# Patient Record
Sex: Male | Born: 1955 | Hispanic: No | Marital: Married | Smoking: Former smoker
Health system: Southern US, Community
[De-identification: ages and names within clinical notes are randomized; demographics above are authoritative.]

## PROBLEM LIST (undated history)

## (undated) DIAGNOSIS — I10 Essential (primary) hypertension: Secondary | ICD-10-CM

## (undated) DIAGNOSIS — E7800 Pure hypercholesterolemia, unspecified: Secondary | ICD-10-CM

---

## 2005-06-27 ENCOUNTER — Emergency Department (HOSPITAL_COMMUNITY): Admission: EM | Admit: 2005-06-27 | Discharge: 2005-06-27 | Payer: Self-pay | Admitting: Emergency Medicine

## 2011-08-04 ENCOUNTER — Other Ambulatory Visit: Payer: Self-pay | Admitting: Physician Assistant

## 2013-05-10 ENCOUNTER — Encounter (HOSPITAL_COMMUNITY): Payer: Self-pay | Admitting: Emergency Medicine

## 2013-05-10 ENCOUNTER — Emergency Department (HOSPITAL_COMMUNITY)
Admission: EM | Admit: 2013-05-10 | Discharge: 2013-05-10 | Disposition: A | Discharge status: Home / Self Care | Payer: Self-pay | Attending: Emergency Medicine | Admitting: Emergency Medicine

## 2013-05-10 DIAGNOSIS — Z87891 Personal history of nicotine dependence: Secondary | ICD-10-CM | POA: Insufficient documentation

## 2013-05-10 DIAGNOSIS — R079 Chest pain, unspecified: Secondary | ICD-10-CM

## 2013-05-10 DIAGNOSIS — R0789 Other chest pain: Secondary | ICD-10-CM | POA: Insufficient documentation

## 2013-05-10 DIAGNOSIS — Z79899 Other long term (current) drug therapy: Secondary | ICD-10-CM | POA: Insufficient documentation

## 2013-05-10 DIAGNOSIS — I10 Essential (primary) hypertension: Secondary | ICD-10-CM | POA: Insufficient documentation

## 2013-05-10 LAB — POCT I-STAT TROPONIN I
TROPONIN I, POC: 0 ng/mL (ref 0.00–0.08)
TROPONIN I, POC: 0.01 ng/mL (ref 0.00–0.08)

## 2013-05-10 LAB — CBC
HEMATOCRIT: 36.9 % — AB (ref 39.0–52.0)
Hemoglobin: 13.2 g/dL (ref 13.0–17.0)
MCH: 32.1 pg (ref 26.0–34.0)
MCHC: 35.8 g/dL (ref 30.0–36.0)
MCV: 89.8 fL (ref 78.0–100.0)
Platelets: 298 10*3/uL (ref 150–400)
RBC: 4.11 MIL/uL — ABNORMAL LOW (ref 4.22–5.81)
RDW: 12.5 % (ref 11.5–15.5)
WBC: 8.8 10*3/uL (ref 4.0–10.5)

## 2013-05-10 LAB — COMPREHENSIVE METABOLIC PANEL
ALBUMIN: 3.6 g/dL (ref 3.5–5.2)
ALK PHOS: 66 U/L (ref 39–117)
ALT: 25 U/L (ref 0–53)
AST: 25 U/L (ref 0–37)
BILIRUBIN TOTAL: 0.3 mg/dL (ref 0.3–1.2)
BUN: 25 mg/dL — ABNORMAL HIGH (ref 6–23)
CO2: 25 mEq/L (ref 19–32)
CREATININE: 1.06 mg/dL (ref 0.50–1.35)
Calcium: 9.3 mg/dL (ref 8.4–10.5)
Chloride: 102 mEq/L (ref 96–112)
GFR calc non Af Amer: 76 mL/min — ABNORMAL LOW (ref >90)
GFR, EST AFRICAN AMERICAN: 88 mL/min — AB (ref >90)
GLUCOSE: 101 mg/dL — AB (ref 70–99)
Potassium: 4 mEq/L (ref 3.7–5.3)
Sodium: 140 mEq/L (ref 137–147)
Total Protein: 8 g/dL (ref 6.0–8.3)

## 2013-05-10 MED ORDER — CLONIDINE HCL 0.1 MG PO TABS
0.1000 mg | ORAL_TABLET | Freq: Once | ORAL | Status: AC
  Administered 2013-05-10: 0.1 mg via ORAL
  Filled 2013-05-10: qty 1

## 2013-05-10 MED ORDER — NITROGLYCERIN 0.4 MG SL SUBL: 0.4000 mg | SUBLINGUAL_TABLET | SUBLINGUAL | Status: DC | PRN

## 2013-05-10 MED ORDER — ASPIRIN 325 MG PO TABS
325.0000 mg | ORAL_TABLET | ORAL | Status: AC
  Administered 2013-05-10: 325 mg via ORAL
  Filled 2013-05-10: qty 1

## 2013-05-10 NOTE — ED Provider Notes (Signed)
CSN: [id number]     Arrival date & time 05/10/13  0025 History   First MD Initiated Contact with Patient 05/10/13 [phone]     Chief Complaint  Patient presents with  . Hypertension  . Chest Pain   (Consider location/radiation/quality/duration/timing/severity/associated sxs/prior Treatment) HPI Comments: 58 yo male with HTN, Chol, Smoker presents with htn and chest pressure.  Pt has had worsening bp since Friday in 180s to 200.  Pt saw pcp and he added amolodipine to his lisinopril/ hctz, pt took extra pill tonight with no improvement.  Chest pressure mild, anterior, non radiating.  Pt felt funny numbness in bilateral face that has resolved.  Sxs improved since arrival.  Pt has similar sxs when his bp is elevated.  No exertional sxs recently.  No known cardiac hx.   Patient is a 58 y.o. male presenting with hypertension and chest pain. The history is provided by the patient.  Hypertension Associated symptoms include chest pain. Pertinent negatives include no abdominal pain, no headaches and no shortness of breath.  Chest Pain Associated symptoms: no abdominal pain, no back pain, no diaphoresis, no fever, no headache, no shortness of breath and not vomiting     History reviewed. No pertinent past medical history. History reviewed. No pertinent past surgical history. History reviewed. No pertinent family history. History  Substance Use Topics  . Smoking status: Former Games developermoker  . Smokeless tobacco: Never Used  . Alcohol Use: Yes    Review of Systems  Constitutional: Negative for fever, chills and diaphoresis.  HENT: Negative for congestion.   Eyes: Negative for visual disturbance.  Respiratory: Negative for shortness of breath.   Cardiovascular: Positive for chest pain. Negative for leg swelling.  Gastrointestinal: Negative for vomiting and abdominal pain.  Genitourinary: Negative for dysuria and flank pain.  Musculoskeletal: Negative for back pain, neck pain and neck stiffness.  Skin:  Negative for rash.  Neurological: Negative for light-headedness and headaches.    Allergies  Review of patient's allergies indicates no known allergies.  Home Medications   Current Outpatient Rx  Name  Route  Sig  Dispense  Refill  . amLODipine (NORVASC) 10 MG tablet   Oral   Take 10 mg by mouth daily.         Marland Kitchen. lisinopril-hydrochlorothiazide (PRINZIDE,ZESTORETIC) 20-25 MG per tablet   Oral   Take 1 tablet by mouth daily.         Marland Kitchen. lovastatin (MEVACOR) 20 MG tablet   Oral   Take 20 mg by mouth daily.          BP 171/84  Pulse 61  Temp(Src) 98.5 F (36.9 C) (Oral)  Resp 12  Ht 5\' 6"  (1.676 m)  Wt 179 lb (81.194 kg)  BMI 28.91 kg/m2  SpO2 99% Physical Exam  Nursing note and vitals reviewed. Constitutional: He is oriented to person, place, and time. He appears well-developed and well-nourished.  HENT:  Head: Normocephalic and atraumatic.  Eyes: Conjunctivae are normal. Right eye exhibits no discharge. Left eye exhibits no discharge.  Neck: Normal range of motion. Neck supple. No tracheal deviation present.  Cardiovascular: Normal rate and regular rhythm.   Pulmonary/Chest: Effort normal and breath sounds normal.  Abdominal: Soft. He exhibits no distension. There is no tenderness. There is no guarding.  Musculoskeletal: He exhibits no edema.  Neurological: He is alert and oriented to person, place, and time. No cranial nerve deficit.  Skin: Skin is warm. No rash noted.  Psychiatric: He has a normal mood and  affect.    ED Course  Procedures (including critical care time) Labs Review Labs Reviewed  CBC - Abnormal; Notable for the following:    RBC 4.11 (*)    HCT 36.9 (*)    All other components within normal limits  COMPREHENSIVE METABOLIC PANEL - Abnormal; Notable for the following:    Glucose, Bld 101 (*)    BUN 25 (*)    GFR calc non Af Amer 76 (*)    GFR calc Af Amer 88 (*)    All other components within normal limits  POCT I-STAT TROPONIN I  POCT  I-STAT TROPONIN I   Imaging Review No results found.  EKG Interpretation    Date/Time:  Tuesday May 10 2013 00:37:32 EST Ventricular Rate:  60 PR Interval:  172 QRS Duration: 96 QT Interval:  416 QTC Calculation: 416 R Axis:   67 Text Interpretation:  Normal sinus rhythm Normal ECG Confirmed by Kaoir Loree  MD, Ananiah Maciolek ([id number]) on 05/10/2013 1:44:47 AM            MDM   1. HTN (hypertension)   2. Chest pain    Heart score 3, atypical cp, resolved as bp improved.  Delta troponin neg. Well appearing, normal bp, CP free on recheck. Clonidine po given .1 mg.  BP improved.   Discussed close fup outpt for stress test, pt agrees with plan.  No signs of end organ damage at this time.   Results and differential diagnosis were discussed with the patient. Close follow up outpatient was discussed, patient comfortable with the plan.  Filed Vitals:   05/10/13 0106 05/10/13 0200 05/10/13 0205 05/10/13 0332  BP: 171/84 148/81 148/81 105/74  Pulse: 61 61  56  Temp: 98.5 F (36.9 C)   97.7 F (36.5 C)  TempSrc: Oral   Oral  Resp: 12 15  13   Height:      Weight:      SpO2: 99% 96%  97%   Diagnosis: above    Enid Skeens, MD 05/10/13 [phone]

## 2013-05-10 NOTE — ED Notes (Signed)
Pt is here because he checked his blood pressure and it was 200/100, and he wanted further evaluation

## 2013-05-10 NOTE — ED Notes (Signed)
PT reports high B/P tonight and CP . Pt was seen today by PCP and Meds were adjusted today  For hypertension. Pt reports he took an extra Lisinopril 25mg / Hydrochlorothiazide25mg   Tonight.

## 2013-05-10 NOTE — Discharge Instructions (Signed)
Take baby aspirin daily until cleared by your physician. Call your physician tomorrow to discuss further evaluation and stress test.  If you were given medicines take as directed.  If you are on coumadin or contraceptives realize their levels and effectiveness is altered by many different medicines.  If you have any reaction (rash, tongues swelling, other) to the medicines stop taking and see a physician.   Please follow up as directed and return to the ER or see a physician for new or worsening symptoms.  Thank you.  Chest Pain (Nonspecific) Chest pain has many causes. Your pain could be caused by something serious, such as a heart attack or a blood clot in the lungs. It could also be caused by something less serious, such as a chest bruise or a virus. Follow up with your doctor. More lab tests or other studies may be needed to find the cause of your pain. Most of the time, nonspecific chest pain will improve within 2 to 3 days of rest and mild pain medicine. HOME CARE  For chest bruises, you may put ice on the sore area for 15-20 minutes, 03-04 times a day. Do this only if it makes you feel better.  Put ice in a plastic bag.  Place a towel between the skin and the bag.  Rest for the next 2 to 3 days.  Go back to work if the pain improves.  See your doctor if the pain lasts longer than 1 to 2 weeks.  Only take medicine as told by your doctor.  Quit smoking if you smoke. GET HELP RIGHT AWAY IF:   There is more pain or pain that spreads to the arm, neck, jaw, back, or belly (abdomen).  You have shortness of breath.  You cough more than usual or cough up blood.  You have very bad back or belly pain, feel sick to your stomach (nauseous), or throw up (vomit).  You have very bad weakness.  You pass out (faint).  You have a fever. Any of these problems may be serious and may be an emergency. Do not wait to see if the problems will go away. Get medical help right away. Call your  local emergency services 911 in [place] Do not drive yourself to the hospital. MAKE SURE YOU:   Understand these instructions.  Will watch this condition.  Will get help right away if you or your child is not doing well or gets worse. Document Released: 09/24/2007 Document Revised: 06/30/2011 Document Reviewed: 09/24/2007 [facility] Patient Information 2014 [employer] [place].

## 2013-05-10 NOTE — ED Notes (Signed)
Pt stated that when his pressure increases he itches around his face and eyes

## 2015-12-25 ENCOUNTER — Ambulatory Visit (INDEPENDENT_AMBULATORY_CARE_PROVIDER_SITE_OTHER): Payer: Self-pay | Admitting: Family Medicine

## 2015-12-25 VITALS — BP 138/78 | HR 58 | Temp 98.1°F | Resp 16 | Ht 66.0 in | Wt 185.0 lb

## 2015-12-25 DIAGNOSIS — Z113 Encounter for screening for infections with a predominantly sexual mode of transmission: Secondary | ICD-10-CM

## 2015-12-25 DIAGNOSIS — Z202 Contact with and (suspected) exposure to infections with a predominantly sexual mode of transmission: Secondary | ICD-10-CM

## 2015-12-25 DIAGNOSIS — J300 Vasomotor rhinitis: Secondary | ICD-10-CM

## 2015-12-25 MED ORDER — AZELASTINE HCL 0.1 % NA SOLN: 1.0000 | Freq: Every evening | NASAL | 1 refills | Status: DC | PRN

## 2015-12-25 MED ORDER — METRONIDAZOLE 500 MG PO TABS: ORAL_TABLET | ORAL | 0 refills | Status: DC

## 2015-12-25 NOTE — Patient Instructions (Addendum)
  If your wife was diagnosed with trichomonas, start the antibiotic as we prescribed - twice per day for 1 week. I did check other sexually transmitted infection testing today.  Your congestion the night is likely due to vasomotor rhinitis. Try the Astelin nasal spray 1 spray in each nostril at bedtime. Follow-up in the next few weeks if that is not helping.  Return to the clinic or go to the nearest emergency room if any of your symptoms worsen or new symptoms occur.    IF you received an x-ray today, you will receive an invoice from [facility]. Please contact [facility] at [phone] with questions or concerns regarding your invoice.   IF you received labwork today, you will receive an invoice from [facility]. Please contact [facility] at [phone] with questions or concerns regarding your invoice.   Our billing staff will not be able to assist you with questions regarding bills from these companies.  You will be contacted with the lab results as soon as they are available. The fastest way to get your results is to activate your My Chart account. Instructions are located on the last page of this paperwork. If you have not heard from us regarding the results in 2 weeks, please contact this office.

## 2015-12-25 NOTE — Progress Notes (Signed)
By signing my name below I, Darryl LewandowskyJoseph Boyd, attest that this documentation has been prepared under the direction and in the presence of Darryl FloodJeffrey R Promyse Ardito, MD. Electonically Signed. Darryl LewandowskyJoseph Boyd, Scribe 12/25/2015 at 5:58 PM  Subjective:    Patient ID: Darryl Boyd, male    DOB: 07/05/1955, 60 y.o.   MRN: [id number]  Chief Complaint  Patient presents with  . Other    patient's wife was just treated for a infection, pt doesn't know what type but was recommended to get checked    HPI Darryl MantisSalvador B Pare is a 60 y.o. male who presents to the [facility] to be checked for genital infection. Pt states that his wife was told by her OB/gyn that she had an infection that could be given to her sexual partner and pt's wife was given flagyl to take for a week. Pt unsure what type of infection.  Pt denies any penile discharge, dysuria, or penile bumps/rash. Pt has been married for 40 years and denies any known sexual partners outside of marriage.   Pt denies any history of STIs.   Pt also reports night time nasal congestion for the past several months.   There are no active problems to display for this patient.  No past medical history on file. No past surgical history on file. No Known Allergies Prior to Admission medications   Medication Sig Start Date End Date Taking? Authorizing Provider  lisinopril-hydrochlorothiazide (PRINZIDE,ZESTORETIC) 20-25 MG per tablet Take 1 tablet by mouth daily.   Yes Historical Provider, MD  lovastatin (MEVACOR) 20 MG tablet Take 20 mg by mouth daily.   Yes Historical Provider, MD  verapamil (CALAN) 80 MG tablet Take 80 mg by mouth 3 (three) times daily.   Yes Historical Provider, MD   Social History   Social History  . Marital status: Married    Spouse name: N/A  . Number of children: N/A  . Years of education: N/A   Occupational History  . Not on file.   Social History Main Topics  . Smoking status: Former Games developermoker  . Smokeless  tobacco: Never Used  . Alcohol use Yes  . Drug use: No  . Sexual activity: Not on file   Other Topics Concern  . Not on file   Social History Narrative  . No narrative on file      Review of Systems  Constitutional: Negative for fever.  HENT: Positive for congestion (night time nasal congestion).   Genitourinary: Negative for difficulty urinating, discharge, dysuria, genital sores, penile pain, penile swelling and testicular pain.       Objective:   Physical Exam  Constitutional: He is oriented to person, place, and time. He appears well-developed and well-nourished. No distress.  HENT:  Head: Normocephalic and atraumatic.  Right Ear: Tympanic membrane, external ear and ear canal normal.  Left Ear: Tympanic membrane, external ear and ear canal normal.  Nose: Mucosal edema (turbinates) present. No rhinorrhea. Right sinus exhibits no maxillary sinus tenderness and no frontal sinus tenderness. Left sinus exhibits no maxillary sinus tenderness and no frontal sinus tenderness.  Mouth/Throat: Oropharynx is clear and moist and mucous membranes are normal. No oropharyngeal exudate or posterior oropharyngeal erythema.  Eyes: Conjunctivae are normal. Pupils are equal, round, and reactive to light.  Neck: Neck supple.  Cardiovascular: Normal rate, regular rhythm, normal heart sounds and intact distal pulses.  Exam reveals no gallop and no friction rub.   No murmur heard. Pulmonary/Chest: Effort normal and  breath sounds normal. No accessory muscle usage. He has no decreased breath sounds. He has no wheezes. He has no rhonchi. He has no rales.  Abdominal: Soft. Normal appearance and bowel sounds are normal. He exhibits no distension. There is no tenderness. There is no rebound and no guarding.  Musculoskeletal: Normal range of motion.  Lymphadenopathy:    He has no cervical adenopathy.  Neurological: He is alert and oriented to person, place, and time.  Skin: Skin is warm and dry. No rash  noted.  Psychiatric: He has a normal mood and affect. His behavior is normal.  Nursing note and vitals reviewed.   Vitals:   12/25/15 1639  BP: 138/78  Pulse: (!) 58  Resp: 16  Temp: 98.1 F (36.7 C)  SpO2: 98%  Weight: 185 lb (83.9 kg)  Height: 5\' 6"  (1.676 m)         Assessment & Plan:   Darryl Boyd is a 60 y.o. male Routine screening for STI (sexually transmitted infection) - Plan: GC/Chlamydia Probe Amp, RPR, HIV antibody Exposure to trichomonas - Plan: GC/Chlamydia Probe Amp, RPR, HIV antibody, metroNIDAZOLE (FLAGYL) 500 MG tablet  -Based on the description of his wife's visit and fact she is on flagyl, as well as advice from her OB/GYN for him to be tested and treated, this appears to be trichomonas, not bacterial vaginosis. Advised him to double check and make sure, and if it was trichomonas, can start Flagyl 500 mg twice a day for 1 week. Option of single 2 g dose was given, but he preferred 500 mg dosing. Other STI testing was performed. Currently asymptomatic.  Vasomotor rhinitis - Plan: azelastine (ASTELIN) 0.1 % nasal spray  -Nighttime symptoms. Can try Astelin nasal spray, then follow-up if not improving.  Meds ordered this encounter  . metroNIDAZOLE (FLAGYL) 500 MG tablet    Sig: 1 pill by mouth twice per day.  Avoid any alcohol while taking this medicine.    Dispense:  14 tablet    Refill:  0  . azelastine (ASTELIN) 0.1 % nasal spray    Sig: Place 1 spray into both nostrils at bedtime as needed for rhinitis. Use in each nostril as directed    Dispense:  30 mL    Refill:  1   Patient Instructions    If your wife was diagnosed with trichomonas, start the antibiotic as we prescribed - twice per day for 1 week. I did check other sexually transmitted infection testing today.  Your congestion the night is likely due to vasomotor rhinitis. Try the Astelin nasal spray 1 spray in each nostril at bedtime. Follow-up in the next few weeks if that is not  helping.  Return to the clinic or go to the nearest emergency room if any of your symptoms worsen or new symptoms occur.    IF you received an x-ray today, you will receive an invoice from [facility]. Please contact [facility] at [phone] with questions or concerns regarding your invoice.   IF you received labwork today, you will receive an invoice from [facility]. Please contact [facility] at [phone] with questions or concerns regarding your invoice.   Our billing staff will not be able to assist you with questions regarding bills from these companies.  You will be contacted with the lab results as soon as they are available. The fastest way to get your results is to activate your My Chart account. Instructions are located on the last page of this  paperwork. If you have not heard from [place] regarding the results in 2 weeks, please contact this office.        I personally performed the services described in this documentation, which was scribed in my presence. The recorded information has been reviewed and considered, and addended by me as needed.   Signed,   Meredith Staggers, MD [facility].  12/26/15 11:11 AM

## 2015-12-26 LAB — HIV ANTIBODY (ROUTINE TESTING W REFLEX): HIV 1&2 Ab, 4th Generation: NONREACTIVE

## 2015-12-27 LAB — GC/CHLAMYDIA PROBE AMP
CT Probe RNA: NOT DETECTED
GC Probe RNA: NOT DETECTED

## 2015-12-27 LAB — RPR

## 2016-04-17 ENCOUNTER — Ambulatory Visit (INDEPENDENT_AMBULATORY_CARE_PROVIDER_SITE_OTHER): Payer: Self-pay | Admitting: Family Medicine

## 2016-04-17 VITALS — BP 124/78 | HR 80 | Temp 99.7°F | Resp 16 | Ht 66.0 in | Wt 184.0 lb

## 2016-04-17 DIAGNOSIS — R0981 Nasal congestion: Secondary | ICD-10-CM

## 2016-04-17 DIAGNOSIS — R509 Fever, unspecified: Secondary | ICD-10-CM

## 2016-04-17 DIAGNOSIS — J069 Acute upper respiratory infection, unspecified: Secondary | ICD-10-CM

## 2016-04-17 DIAGNOSIS — R059 Cough, unspecified: Secondary | ICD-10-CM

## 2016-04-17 DIAGNOSIS — R05 Cough: Secondary | ICD-10-CM

## 2016-04-17 MED ORDER — HYDROCOD POLST-CPM POLST ER 10-8 MG/5ML PO SUER: 5.0000 mL | Freq: Every evening | ORAL | 0 refills | Status: DC | PRN

## 2016-04-17 MED ORDER — FLUTICASONE PROPIONATE 50 MCG/ACT NA SUSP: 2.0000 | Freq: Every day | NASAL | 6 refills | Status: DC

## 2016-04-17 MED ORDER — BENZONATATE 100 MG PO CAPS: 100.0000 mg | ORAL_CAPSULE | Freq: Three times a day (TID) | ORAL | 2 refills | Status: DC | PRN

## 2016-04-17 NOTE — Patient Instructions (Addendum)
Take flonase by spraying 2 sprays per nostril once a day Continue tylenol for aches and pains and fevers Take tussionex pennkinetic ER at bedtime for cough.  This medication has hydrocodone which is made from morphine.  Do not operate machinery or drive while taking this medication as it can make you drowsy or dizzy Take benzonatate (tessalon perles) up to 3 times a day as needed   IF you received an x-ray today, you will receive an invoice from [facility]. Please contact [facility] at [phone] with questions or concerns regarding your invoice.   IF you received labwork today, you will receive an invoice from [facility]. Please contact [facility] at [phone] with questions or concerns regarding your invoice.   Our billing staff will not be able to assist you with questions regarding bills from these companies.  You will be contacted with the lab results as soon as they are available. The fastest way to get your results is to activate your My Chart account. Instructions are located on the last page of this paperwork. If you have not heard from us regarding the results in 2 weeks, please contact this office.      Upper Respiratory Infection, Adult Most upper respiratory infections (URIs) are a viral infection of the air passages leading to the lungs. A URI affects the nose, throat, and upper air passages. The most common type of URI is nasopharyngitis and is typically referred to as "the common cold." URIs run their course and usually go away on their own. Most of the time, a URI does not require medical attention, but sometimes a bacterial infection in the upper airways can follow a viral infection. This is called a secondary infection. Sinus and middle ear infections are common types of secondary upper respiratory infections. Bacterial pneumonia can also complicate a URI. A URI can worsen asthma and chronic obstructive pulmonary disease (COPD). Sometimes, these  complications can require emergency medical care and may be life threatening. What are the causes? Almost all URIs are caused by viruses. A virus is a type of germ and can spread from one person to another. What increases the risk? You may be at risk for a URI if:  You smoke.  You have chronic heart or lung disease.  You have a weakened defense (immune) system.  You are very young or very old.  You have nasal allergies or asthma.  You work in crowded or poorly ventilated areas.  You work in health care facilities or schools. What are the signs or symptoms? Symptoms typically develop 2-3 days after you come in contact with a cold virus. Most viral URIs last 7-10 days. However, viral URIs from the influenza virus (flu virus) can last 14-18 days and are typically more severe. Symptoms may include:  Runny or stuffy (congested) nose.  Sneezing.  Cough.  Sore throat.  Headache.  Fatigue.  Fever.  Loss of appetite.  Pain in your forehead, behind your eyes, and over your cheekbones (sinus pain).  Muscle aches. How is this diagnosed? Your health care provider may diagnose a URI by:  Physical exam.  Tests to check that your symptoms are not due to another condition such as:  Strep throat.  Sinusitis.  Pneumonia.  Asthma. How is this treated? A URI goes away on its own with time. It cannot be cured with medicines, but medicines may be prescribed or recommended to relieve symptoms. Medicines may help:  Reduce your fever.  Reduce your cough.  Relieve nasal congestion.  Follow these instructions at home:  Take medicines only as directed by your health care provider.  Gargle warm saltwater or take cough drops to comfort your throat as directed by your health care provider.  Use a warm mist humidifier or inhale steam from a shower to increase air moisture. This may make it easier to breathe.  Drink enough fluid to keep your urine clear or pale yellow.  Eat  soups and other clear broths and maintain good nutrition.  Rest as needed.  Return to work when your temperature has returned to normal or as your health care provider advises. You may need to stay home longer to avoid infecting others. You can also use a face mask and careful hand washing to prevent spread of the virus.  Increase the usage of your inhaler if you have asthma.  Do not use any tobacco products, including cigarettes, chewing tobacco, or electronic cigarettes. If you need help quitting, ask your health care provider. How is this prevented? The best way to protect yourself from getting a cold is to practice good hygiene.  Avoid oral or hand contact with people with cold symptoms.  Wash your hands often if contact occurs. There is no clear evidence that vitamin C, vitamin E, echinacea, or exercise reduces the chance of developing a cold. However, it is always recommended to get plenty of rest, exercise, and practice good nutrition. Contact a health care provider if:  You are getting worse rather than better.  Your symptoms are not controlled by medicine.  You have chills.  You have worsening shortness of breath.  You have brown or red mucus.  You have yellow or brown nasal discharge.  You have pain in your face, especially when you bend forward.  You have a fever.  You have swollen neck glands.  You have pain while swallowing.  You have white areas in the back of your throat. Get help right away if:  You have severe or persistent:  Headache.  Ear pain.  Sinus pain.  Chest pain.  You have chronic lung disease and any of the following:  Wheezing.  Prolonged cough.  Coughing up blood.  A change in your usual mucus.  You have a stiff neck.  You have changes in your:  Vision.  Hearing.  Thinking.  Mood. This information is not intended to replace advice given to you by your health care provider. Make sure you discuss any questions you have  with your health care provider. Document Released: 10/01/2000 Document Revised: 12/09/2015 Document Reviewed: 07/13/2013 Elsevier Interactive Patient Education  2017 [employer]

## 2016-04-17 NOTE — Progress Notes (Signed)
Chief Complaint  Patient presents with  . Diarrhea    symptoms began 2 weeks ago  . Chills  . Fever  . Headache  . Nausea  . Nasal Congestion  . Cough    HPI Pt reports that he has been sick for 2 weeks 2 weeks ago he had chills and one episode of diarrhea then his symptoms got worse with headache, nasal congestion, fever, chills, nausea but no vomiting, no skin rash.  He reports that last night he felt hot and check a temperature and it was 100.5 oral.  He took tylenol for fever. His grandchildren were sick before he got sick.  No asthma He smokes 1 cigarette a week He is taking all his regular medication   No past medical history on file.  Current Outpatient Prescriptions  Medication Sig Dispense Refill  . lisinopril-hydrochlorothiazide (PRINZIDE,ZESTORETIC) 20-25 MG per tablet Take 1 tablet by mouth daily.    Marland Kitchen. lovastatin (MEVACOR) 20 MG tablet Take 20 mg by mouth daily.    . verapamil (CALAN) 80 MG tablet Take 80 mg by mouth once.     . benzonatate (TESSALON PERLES) 100 MG capsule Take 1 capsule (100 mg total) by mouth 3 (three) times daily as needed for cough. 30 capsule 2  . chlorpheniramine-HYDROcodone (TUSSIONEX PENNKINETIC ER) 10-8 MG/5ML SUER Take 5 mLs by mouth at bedtime as needed for cough. 115 mL 0  . fluticasone (FLONASE) 50 MCG/ACT nasal spray Place 2 sprays into both nostrils daily. 16 g 6   No current facility-administered medications for this visit.     Allergies: No Known Allergies  No past surgical history on file.  Social History   Social History  . Marital status: Married    Spouse name: N/A  . Number of children: N/A  . Years of education: N/A   Social History Main Topics  . Smoking status: Former Games developermoker  . Smokeless tobacco: Never Used  . Alcohol use Yes  . Drug use: No  . Sexual activity: Not Asked   Other Topics Concern  . None   Social History Narrative  . None    ROS See hpi Objective: Vitals:   04/17/16 1120  BP:  124/78  Pulse: 80  Resp: 16  Temp: 99.7 F (37.6 C)  TempSrc: Oral  SpO2: 97%  Weight: 184 lb (83.5 kg)  Height: 5\' 6"  (1.676 m)    Physical Exam General: alert, oriented, in NAD Head: normocephalic, atraumatic, no sinus tenderness Eyes: EOM intact, no scleral icterus or conjunctival injection Ears: TM clear bilaterally Throat: no pharyngeal exudate or erythema Lymph: no posterior auricular, submental or cervical lymph adenopathy Heart: normal rate, normal sinus rhythm, no murmurs Lungs: clear to auscultation bilaterally, no wheezing Abdomen: nondistended, normoactive bs, soft, nontender, no rebound or guarding  Assessment and Plan Darryl Boyd was seen today for diarrhea, chills, fever, headache, nausea, nasal congestion and cough.  Diagnoses and all orders for this visit:  Cough Acute URI Sinus congestion Fever and chills  Advised pt to continue hydration and tylenol Discussed ways to suppress cough  Will try tussionex for cough suppression  -     benzonatate (TESSALON PERLES) 100 MG capsule; Take 1 capsule (100 mg total) by mouth 3 (three) times daily as needed for cough. -     chlorpheniramine-HYDROcodone (TUSSIONEX PENNKINETIC ER) 10-8 MG/5ML SUER; Take 5 mLs by mouth at bedtime as needed for cough. -     fluticasone (FLONASE) 50 MCG/ACT nasal spray; Place 2 sprays into both  nostrils daily.     Darryl Boyd A Darryl Boyd

## 2017-06-15 ENCOUNTER — Ambulatory Visit (INDEPENDENT_AMBULATORY_CARE_PROVIDER_SITE_OTHER): Payer: Self-pay | Admitting: Family Medicine

## 2017-06-15 ENCOUNTER — Other Ambulatory Visit: Payer: Self-pay

## 2017-06-15 ENCOUNTER — Encounter: Payer: Self-pay | Admitting: Family Medicine

## 2017-06-15 VITALS — BP 156/89 | HR 70 | Temp 98.5°F | Resp 16 | Ht 66.0 in | Wt 189.2 lb

## 2017-06-15 DIAGNOSIS — E785 Hyperlipidemia, unspecified: Secondary | ICD-10-CM

## 2017-06-15 DIAGNOSIS — Z1211 Encounter for screening for malignant neoplasm of colon: Secondary | ICD-10-CM

## 2017-06-15 DIAGNOSIS — Z6830 Body mass index (BMI) 30.0-30.9, adult: Secondary | ICD-10-CM

## 2017-06-15 DIAGNOSIS — I10 Essential (primary) hypertension: Secondary | ICD-10-CM

## 2017-06-15 DIAGNOSIS — L290 Pruritus ani: Secondary | ICD-10-CM

## 2017-06-15 DIAGNOSIS — E6609 Other obesity due to excess calories: Secondary | ICD-10-CM

## 2017-06-15 MED ORDER — VERAPAMIL HCL 80 MG PO TABS: 80.0000 mg | ORAL_TABLET | Freq: Every day | ORAL | 1 refills | Status: AC

## 2017-06-15 MED ORDER — LOVASTATIN 20 MG PO TABS: 20.0000 mg | ORAL_TABLET | Freq: Every day | ORAL | 1 refills | Status: AC

## 2017-06-15 MED ORDER — HYDROCORTISONE 2.5 % RE CREA: 1.0000 "application " | TOPICAL_CREAM | Freq: Two times a day (BID) | RECTAL | 0 refills | Status: DC

## 2017-06-15 MED ORDER — LISINOPRIL-HYDROCHLOROTHIAZIDE 20-25 MG PO TABS: 1.0000 | ORAL_TABLET | Freq: Every day | ORAL | 1 refills | Status: AC

## 2017-06-15 MED ORDER — FAMOTIDINE 20 MG PO TABS: 20.0000 mg | ORAL_TABLET | Freq: Two times a day (BID) | ORAL | 3 refills | Status: DC

## 2017-06-15 MED ORDER — OMEPRAZOLE 20 MG PO CPDR: 20.0000 mg | DELAYED_RELEASE_CAPSULE | Freq: Every day | ORAL | 3 refills | Status: DC

## 2017-06-15 NOTE — Progress Notes (Signed)
Chief Complaint  Patient presents with  . Cough    follow up, pt doesn't have cough now.  Pt wanting refills on lisinopril-hctz, lovastatin, and verapamil.  Per pt he forgot to take meds today.    HPI   Hypertension: Patient here for follow-up of elevated blood pressure. He is not exercising and is adherent to low salt diet.  Blood pressure is well controlled at home. He missed his doses today.  He reports that he was seeing a doctor elsewhere who refilled his medications and checked labs.  Cardiac symptoms none. Patient denies chest pain, chest pressure/discomfort, dyspnea, exertional chest pressure/discomfort, fatigue, lower extremity edema and palpitations.  Cardiovascular risk factors: advanced age (older than 33 for men, 11 for women), dyslipidemia, hypertension and male gender. Use of agents associated with hypertension: none. History of target organ damage: none. Lab Results  Component Value Date   CREATININE 1.06 05/10/2013    Dyslipidemia: Patient presents for evaluation of lipids.  Compliance with treatment thus far has been good.  A repeat fasting lipid profile was ordered.  The patient does use medications that may worsen dyslipidemias (corticosteroids, progestins, anabolic steroids, diuretics, beta-blockers, amiodarone, cyclosporine, olanzapine). The patient exercises never.  The patient is not known to have coexisting coronary artery disease.   Obesity Pt works in [employer] He has been giving up fast food He cooks most meals at home He avoids red meat He has signed up for [employer] Readings from Last 3 Encounters:  06/15/17 189 lb 3.2 oz (85.8 kg)  04/17/16 184 lb (83.5 kg)  12/25/15 185 lb (83.9 kg)  Body mass index is 30.54 kg/m.  Reflux and Anal Itching Pt reports that at night he has been having reflux and has to sleep on 2 pillows He states that his reflux He has rectal itching and feels like inside of his stomach burns He used antibiotic cream from  the pharmacy and uses aloe vera He denies any bulging until after he starts scratching his rectum He denies any history of hemorrhoid Symptoms present 3-4 weeks States that it feels like the area is     History reviewed. No pertinent past medical history.  Current Outpatient Medications  Medication Sig Dispense Refill  . lisinopril-hydrochlorothiazide (PRINZIDE,ZESTORETIC) 20-25 MG per tablet Take 1 tablet by mouth daily.    Marland Kitchen lovastatin (MEVACOR) 20 MG tablet Take 20 mg by mouth daily.    . verapamil (CALAN) 80 MG tablet Take 80 mg by mouth once.     . famotidine (PEPCID) 20 MG tablet Take 1 tablet (20 mg total) by mouth 2 (two) times daily. Before lunch and before dinner 60 tablet 3  . hydrocortisone (ANUSOL-HC) 2.5 % rectal cream Place 1 application rectally 2 (two) times daily. 30 g 0  . omeprazole (PRILOSEC) 20 MG capsule Take 1 capsule (20 mg total) by mouth daily. Before breakfast 30 capsule 3   No current facility-administered medications for this visit.     Allergies: No Known Allergies  History reviewed. No pertinent surgical history.  Social History   Socioeconomic History  . Marital status: Married    Spouse name: None  . Number of children: None  . Years of education: None  . Highest education level: None  Social Needs  . Financial resource strain: None  . Food insecurity - worry: None  . Food insecurity - inability: None  . Transportation needs - medical: None  . Transportation needs - non-medical: None  Occupational History  . None  Tobacco Use  . Smoking status: Former Games developermoker  . Smokeless tobacco: Never Used  Substance and Sexual Activity  . Alcohol use: Yes  . Drug use: No  . Sexual activity: None  Other Topics Concern  . None  Social History Narrative  . None    History reviewed. No pertinent family history.   ROS Review of Systems See HPI Constitution: No fevers or chills No malaise No diaphoresis Skin: No rash or itching Eyes: no  blurry vision, no double vision GU: no dysuria or hematuria Neuro: no dizziness or headaches  all others reviewed and negative   Objective: Vitals:   06/15/17 1417  BP: (!) 156/89  Pulse: 70  Resp: 16  Temp: 98.5 F (36.9 C)  TempSrc: Oral  SpO2: 97%  Weight: 189 lb 3.2 oz (85.8 kg)  Height: 5\' 6"  (1.676 m)    Physical Exam  Constitutional: He is oriented to person, place, and time. He appears well-developed and well-nourished.  HENT:  Head: Normocephalic and atraumatic.  Eyes: Conjunctivae and EOM are normal.  Cardiovascular: Normal rate, regular rhythm and normal heart sounds.  Pulmonary/Chest: Effort normal and breath sounds normal. No stridor. No respiratory distress. He has no wheezes. He has no rales.  Genitourinary: Prostate normal. Rectal exam shows fissure. Rectal exam shows no external hemorrhoid, no internal hemorrhoid, no mass, no tenderness, anal tone normal and guaiac negative stool.    Prostate is not enlarged and not tender.  Neurological: He is alert and oriented to person, place, and time.  Psychiatric: He has a normal mood and affect. His behavior is normal. Judgment and thought content normal.    Assessment and Plan Darryl Boyd was seen today for cough.  Diagnoses and all orders for this visit:  Essential hypertension -     Lipid panel; Future -     Comprehensive metabolic panel; Future Medication Refilled today Refilled bp medication  Pt to return for fasting labs  Dyslipidemia -     Lipid panel; Future -     Comprehensive metabolic panel; Future Medication Refilled today Refilled lipid medication  Pt to return for fasting labs  Class 1 obesity due to excess calories with serious comorbidity and body mass index (BMI) of 30.0 to 30.9 in adult- advised weight loss to improve heart health  Rectal itching- discussed that he should use topical relief to prevent further fissures He should also follow up with GI for colorectal cancer screening -      Ambulatory referral to Gastroenterology -     hydrocortisone (ANUSOL-HC) 2.5 % rectal cream; Place 1 application rectally 2 (two) times daily.  Screening for colon cancer- gave number for Patient Financial Assistance Program to help with cost of GI consult and colonoscopy -     Ambulatory referral to Gastroenterology  GERD -     omeprazole (PRILOSEC) 20 MG capsule; Take 1 capsule (20 mg total) by mouth daily. Before breakfast -     famotidine (PEPCID) 20 MG tablet; Take 1 tablet (20 mg total) by mouth 2 (two) times daily. Before lunch and before dinner       Zoe A Creta LevinStallings

## 2017-06-15 NOTE — Patient Instructions (Addendum)
IF you received an x-ray today, you will receive an invoice from [facility]. Please contact [facility] at [phone] with questions or concerns regarding your invoice.   IF you received labwork today, you will receive an invoice from [facility]. Please contact [facility] at [phone] with questions or concerns regarding your invoice.   Our billing staff will not be able to assist you with questions regarding bills from these companies.  You will be contacted with the lab results as soon as they are available. The fastest way to get your results is to activate your My Chart account. Instructions are located on the last page of this paperwork. If you have not heard from [place] regarding the results in 2 weeks, please contact this office.     Anal Pruritus Anal pruritus is an itchy feeling in the anus and the skin in the anal area. This is common and can be caused by many things. It often occurs when the area becomes moist. Moisture may be due to sweating or a small amount of stool (feces) that is left on the area because of poor personal cleaning. Some other causes include:  Perfumed soaps and sprays.  Colored toilet paper.  Chemicals in the foods that you eat.  Dietary factors, such as caffeine, beer, milk products, chocolate, nuts, citrus fruits, tomatoes, spicy seasonings, jalapeno peppers, and salsa.  Hemorrhoids, fissures, infections, and other anal diseases.  Excessive washing.  Overuse of laxatives.  Skin disorders (psoriasis, eczema, or seborrhea).  Some medical disorders, such as diabetes or thyroid problems.  Diarrhea.  STDs (sexually transmitted diseases).  Some cancers.  In many cases, the cause is not known. The itching usually goes away with treatment and home care. Scratching can cause further skin damage. Follow these instructions at home: Pay attention to any changes in your symptoms. Take these actions to help with your itching: Skin  Care  Practice good hygiene. ? Clean the anal area gently with wet toilet paper, baby wipes, or a wet washcloth after every bowel movement and at bedtime. ? Avoid using soaps on the anal area. ? Dry the area thoroughly. Pat the area dry with toilet paper or a towel.  Do not scrub the anal area with anything, including toilet paper.  Do not scratch the itchy area. Scratching produces more damage and makes the itching worse.  Take sitz baths in warm water as told by your health care provider. Pat the area dry with a soft cloth after each bath.  Use creams or ointments as told by your health care provider. Zinc oxide ointment or a moisture barrier cream can be applied several times per day to protect the skin.  Do not use anything that irritates the skin, such as bubble baths, scented toilet paper, or genital deodorants. General instructions  Take over-the-counter and prescription medicines only as told by your health care provider.  Talk with your health care provider about fiber supplements. These are helpful in keeping your stool normal if you have frequent loose stools.  Wear cotton underwear and loose clothing.  Keep all follow-up visits as told by your health care provider. This is important. Contact a health care provider if:  Your itching does not improve in several days.  Your itching gets worse.  You have a fever.  You have redness, swelling, or pain in the anal area.  You have fluid, blood, or pus coming from the anal area. This information is not intended to replace advice given to  you by your health care provider. Make sure you discuss any questions you have with your health care provider. Document Released: 10/07/2010 Document Revised: 09/13/2015 Document Reviewed: 07/03/2014 Elsevier Interactive Patient Education  [employer]2018 Elsevier Inc.

## 2017-06-24 ENCOUNTER — Ambulatory Visit: Payer: Self-pay | Admitting: Family Medicine

## 2017-06-30 ENCOUNTER — Ambulatory Visit (INDEPENDENT_AMBULATORY_CARE_PROVIDER_SITE_OTHER): Payer: Self-pay | Admitting: Family Medicine

## 2017-06-30 DIAGNOSIS — I10 Essential (primary) hypertension: Secondary | ICD-10-CM

## 2017-06-30 DIAGNOSIS — E785 Hyperlipidemia, unspecified: Secondary | ICD-10-CM

## 2017-07-01 LAB — COMPREHENSIVE METABOLIC PANEL
ALK PHOS: 67 IU/L (ref 39–117)
ALT: 27 IU/L (ref 0–44)
AST: 21 IU/L (ref 0–40)
Albumin/Globulin Ratio: 2.5 — ABNORMAL HIGH (ref 1.2–2.2)
Albumin: 4.7 g/dL (ref 3.6–4.8)
BILIRUBIN TOTAL: 0.6 mg/dL (ref 0.0–1.2)
BUN/Creatinine Ratio: 15 (ref 10–24)
BUN: 16 mg/dL (ref 8–27)
CHLORIDE: 101 mmol/L (ref 96–106)
CO2: 25 mmol/L (ref 20–29)
Calcium: 9.5 mg/dL (ref 8.6–10.2)
Creatinine, Ser: 1.07 mg/dL (ref 0.76–1.27)
GFR calc Af Amer: 86 mL/min/{1.73_m2} (ref >59)
GFR calc non Af Amer: 74 mL/min/{1.73_m2} (ref >59)
GLUCOSE: 104 mg/dL — AB (ref 65–99)
Globulin, Total: 1.9 g/dL (ref 1.5–4.5)
POTASSIUM: 4.3 mmol/L (ref 3.5–5.2)
Sodium: 140 mmol/L (ref 134–144)
Total Protein: 6.6 g/dL (ref 6.0–8.5)

## 2017-07-01 LAB — LIPID PANEL
CHOLESTEROL TOTAL: 203 mg/dL — AB (ref 100–199)
Chol/HDL Ratio: 4.8 ratio (ref 0.0–5.0)
HDL: 42 mg/dL (ref >39)
LDL Calculated: 139 mg/dL — ABNORMAL HIGH (ref 0–99)
Triglycerides: 112 mg/dL (ref 0–149)
VLDL CHOLESTEROL CAL: 22 mg/dL (ref 5–40)

## 2017-07-07 ENCOUNTER — Encounter: Payer: Self-pay | Admitting: *Deleted

## 2017-07-07 ENCOUNTER — Telehealth: Payer: Self-pay | Admitting: Family Medicine

## 2017-07-07 NOTE — Telephone Encounter (Signed)
Spoke with patient advised of results.  Voiced understanding

## 2017-07-07 NOTE — Telephone Encounter (Signed)
Pt states it is ok to leave a vmail.

## 2017-07-07 NOTE — Telephone Encounter (Signed)
Copied from CRM [phone]. Topic: Quick Communication - Office Called Patient >> Jul 07, 2017  9:46 AM Clack, Princella PellegriniJessica D wrote: Reason for CRM: Pt calling office back for labs.

## 2017-07-17 NOTE — Progress Notes (Signed)
Lab visit only.  Did not see provider at this visit.  

## 2017-08-28 ENCOUNTER — Encounter: Payer: Self-pay | Admitting: Family Medicine

## 2017-09-04 ENCOUNTER — Encounter (HOSPITAL_COMMUNITY): Payer: Self-pay | Admitting: Emergency Medicine

## 2017-09-04 ENCOUNTER — Ambulatory Visit (INDEPENDENT_AMBULATORY_CARE_PROVIDER_SITE_OTHER): Payer: Self-pay

## 2017-09-04 ENCOUNTER — Ambulatory Visit (HOSPITAL_COMMUNITY)
Admission: EM | Admit: 2017-09-04 | Discharge: 2017-09-04 | Disposition: A | Discharge status: Home / Self Care | Payer: Self-pay | Attending: Family Medicine | Admitting: Family Medicine

## 2017-09-04 ENCOUNTER — Other Ambulatory Visit: Payer: Self-pay

## 2017-09-04 DIAGNOSIS — S62646A Nondisplaced fracture of proximal phalanx of right little finger, initial encounter for closed fracture: Secondary | ICD-10-CM

## 2017-09-04 HISTORY — DX: Essential (primary) hypertension: I10

## 2017-09-04 HISTORY — DX: Pure hypercholesterolemia, unspecified: E78.00

## 2017-09-04 MED ORDER — HYDROCODONE-ACETAMINOPHEN 5-325 MG PO TABS: 1.0000 | ORAL_TABLET | Freq: Four times a day (QID) | ORAL | 0 refills | Status: AC | PRN

## 2017-09-04 MED ORDER — IBUPROFEN 800 MG PO TABS: 800.0000 mg | ORAL_TABLET | Freq: Three times a day (TID) | ORAL | 0 refills | Status: AC

## 2017-09-04 NOTE — ED Triage Notes (Signed)
The patient presented to the [facility] with a complaint of pain to his pinky finger on his right hand secondary to it getting hit with the handle of a drill press.

## 2017-09-04 NOTE — ED Notes (Signed)
Ortho at bedside. Pt discharged by provider.

## 2017-09-04 NOTE — ED Provider Notes (Signed)
[facility]    CSN: [id number] Arrival date & time: 09/04/17  1300     History   Chief Complaint Chief Complaint  Patient presents with  . Finger Injury    HPI Darryl Boyd is a 62 y.o. male history of hypertension and hypercholesterolemia presenting today for evaluation of right little finger injury.  Patient was working on a project at [employer] earlier today.  He was holding a drill in his hand, the drill ended up spinning and the base of it hit his right little finger as well as twisting his hand at the same time.  Since he has had significant pain at the base of his finger as well as swelling.  Denies any numbness or tingling.  HPI  Past Medical History:  Diagnosis Date  . Hypercholesteremia   . Hypertension     Patient Active Problem List   Diagnosis Date Noted  . Essential hypertension 06/15/2017  . Dyslipidemia 06/15/2017  . Class 1 obesity due to excess calories with serious comorbidity and body mass index (BMI) of 30.0 to 30.9 in adult 06/15/2017    History reviewed. No pertinent surgical history.     Home Medications    Prior to Admission medications   Medication Sig Start Date End Date Taking? Authorizing Provider  famotidine (PEPCID) 20 MG tablet Take 1 tablet (20 mg total) by mouth 2 (two) times daily. Before lunch and before dinner 06/15/17   Doristine Bosworth, MD  HYDROcodone-acetaminophen (NORCO/VICODIN) 5-325 MG tablet Take 1 tablet by mouth every 6 (six) hours as needed for up to 3 days. 09/04/17 09/07/17  Chelcey Caputo C, PA-C  hydrocortisone (ANUSOL-HC) 2.5 % rectal cream Place 1 application rectally 2 (two) times daily. 06/15/17   Doristine Bosworth, MD  ibuprofen (ADVIL,MOTRIN) 800 MG tablet Take 1 tablet (800 mg total) by mouth 3 (three) times daily. 09/04/17   Damien Cisar C, PA-C  lisinopril-hydrochlorothiazide (PRINZIDE,ZESTORETIC) 20-25 MG tablet Take 1 tablet by mouth daily. 06/15/17   Doristine Bosworth, MD  lovastatin  (MEVACOR) 20 MG tablet Take 1 tablet (20 mg total) by mouth daily. 06/15/17   Doristine Bosworth, MD  omeprazole (PRILOSEC) 20 MG capsule Take 1 capsule (20 mg total) by mouth daily. Before breakfast 06/15/17   Doristine Bosworth, MD  verapamil (CALAN) 80 MG tablet Take 1 tablet (80 mg total) by mouth daily. 06/15/17   Doristine Bosworth, MD    Family History History reviewed. No pertinent family history.  Social History Social History   Tobacco Use  . Smoking status: Former Games developer  . Smokeless tobacco: Never Used  Substance Use Topics  . Alcohol use: Yes  . Drug use: No     Allergies   Patient has no known allergies.   Review of Systems Review of Systems  Constitutional: Negative for fatigue and fever.  Respiratory: Negative for shortness of breath.   Cardiovascular: Negative for chest pain.  Gastrointestinal: Negative for abdominal pain, nausea and vomiting.  Musculoskeletal: Positive for arthralgias, joint swelling and myalgias.  Skin: Positive for color change. Negative for rash and wound.  Neurological: Negative for dizziness, syncope, weakness, numbness and headaches.     Physical Exam Triage Vital Signs ED Triage Vitals  Enc Vitals Group     BP 09/04/17 1330 (!) 174/82     Pulse Rate 09/04/17 1330 (!) 51     Resp 09/04/17 1330 18     Temp 09/04/17 1330 98.2 F (36.8 C)  Temp Source 09/04/17 1330 Oral     SpO2 09/04/17 1330 98 %     Weight --      Height --      Head Circumference --      Peak Flow --      Pain Score 09/04/17 1329 8     Pain Loc --      Pain Edu? --      Excl. in GC? --    No data found.  Updated Vital Signs BP (!) 174/82 (BP Location: Left Arm)   Pulse (!) 51   Temp 98.2 F (36.8 C) (Oral)   Resp 18   SpO2 98%   Visual Acuity Right Eye Distance:   Left Eye Distance:   Bilateral Distance:    Right Eye Near:   Left Eye Near:    Bilateral Near:     Physical Exam  Constitutional: He appears well-developed and well-nourished.    HENT:  Head: Normocephalic and atraumatic.  Eyes: Conjunctivae are normal.  Neck: Neck supple.  Cardiovascular: Normal rate and regular rhythm.  No murmur heard. Pulmonary/Chest: Effort normal and breath sounds normal. No respiratory distress.  Abdominal: Soft. There is no tenderness.  Musculoskeletal: He exhibits no edema.  Significant swelling and tenderness to base of proximal phalanx of fifth little finger as well as distal fifth metacarpal.  Limited range of motion, full active range of motion at wrist, radial pulse 2+  Neurological: He is alert.  Skin: Skin is warm and dry.  Psychiatric: He has a normal mood and affect.  Nursing note and vitals reviewed.    UC Treatments / Results  Labs (all labs ordered are listed, but only abnormal results are displayed) Labs Reviewed - No data to display  EKG None  Radiology Dg Hand Complete Right  Result Date: 09/04/2017 CLINICAL DATA:  The patient was struck in the right hand by a drill today with onset of pain. EXAM: RIGHT HAND - COMPLETE 3+ VIEW COMPARISON:  None. FINDINGS: The patient has a fracture of the proximal phalanx of the right little finger. Main component of the fracture is oblique in orientation from the radial metaphysis through the proximal diaphysis on the ulnar side. Nondisplaced component of the fracture extends superiorly to the articular surface. No other acute bony or joint abnormality is identified. IMPRESSION: Nondisplaced fracture with intra-articular extension base of the proximal phalanx of the right little finger. Electronically Signed   By: Drusilla Kanner M.D.   On: 09/04/2017 14:17    Procedures Procedures (including critical care time)  Medications Ordered in UC Medications - No data to display  Initial Impression / Assessment and Plan / UC Course  I have reviewed the triage vital signs and the nursing notes.  Pertinent labs & imaging results that were available during my care of the patient were  reviewed by me and considered in my medical decision making (see chart for details).     Nondisplaced fracture proximal phalanx of fifth little finger.  Will provide ulnar gutter given located at articular base with metacarpal.  Follow-up with hand.  Discussed using Tylenol and ibuprofen for mild to moderate pain, may use hydrocodone for more severe pain, discussed sedation regarding hydrocodone.Discussed strict return precautions. Patient verbalized understanding and is agreeable with plan.  Final Clinical Impressions(s) / UC Diagnoses   Final diagnoses:  Closed nondisplaced fracture of proximal phalanx of right little finger, initial encounter     Discharge Instructions     For mild-moderate pain:  Use anti-inflammatories for pain/swelling. You may take up to 800 mg Ibuprofen every 8 hours with food. You may supplement Ibuprofen with Tylenol [phone] mg every 8 hours.   For severe pain: you may use hydrocodone, this will cause sedation, use sparingly or only at bedtime. Do not drive after use.  Follow up with Hand- contact information below    ED Prescriptions    Medication Sig Dispense Auth. Provider   HYDROcodone-acetaminophen (NORCO/VICODIN) 5-325 MG tablet Take 1 tablet by mouth every 6 (six) hours as needed for up to 3 days. 12 tablet Ardeth Repetto C, PA-C   ibuprofen (ADVIL,MOTRIN) 800 MG tablet Take 1 tablet (800 mg total) by mouth 3 (three) times daily. 30 tablet Decorey Wahlert, Freeman C, PA-C     Controlled Substance Prescriptions [place] Controlled Substance Registry consulted? No   Lew Dawes, [place] 09/04/17 2217

## 2017-09-04 NOTE — Discharge Instructions (Signed)
For mild-moderate pain: Use anti-inflammatories for pain/swelling. You may take up to 800 mg Ibuprofen every 8 hours with food. You may supplement Ibuprofen with Tylenol [phone] mg every 8 hours.   For severe pain: you may use hydrocodone, this will cause sedation, use sparingly or only at bedtime. Do not drive after use.  Follow up with Hand- contact information below

## 2017-09-04 NOTE — Progress Notes (Signed)
Orthopedic Tech Progress Note Patient Details:  Darryl Boyd 08/31/1955 [id number]  Ortho Devices Type of Ortho Device: Ace wrap, Ulna gutter splint Ortho Device/Splint Location: rue Ortho Device/Splint Interventions: Application   Post Interventions Patient Tolerated: Well Instructions Provided: Care of device   [name] 09/04/2017, 3:46 PM

## 2018-01-13 ENCOUNTER — Telehealth: Payer: Self-pay | Admitting: Family Medicine

## 2018-01-13 NOTE — Telephone Encounter (Signed)
Patient has appt 02/02/2018 but is going to have the pharmacy call for a refill to get him through until his appt time

## 2018-01-13 NOTE — Telephone Encounter (Signed)
Noted - I am ok with one refill since he has upcoming appointment - needs to keep that appt.

## 2018-02-02 ENCOUNTER — Ambulatory Visit: Payer: Self-pay | Admitting: Family Medicine

## 2018-03-01 ENCOUNTER — Ambulatory Visit (HOSPITAL_COMMUNITY)
Admission: EM | Admit: 2018-03-01 | Discharge: 2018-03-01 | Disposition: A | Discharge status: Home / Self Care | Payer: Self-pay | Attending: Family Medicine | Admitting: Family Medicine

## 2018-03-01 ENCOUNTER — Other Ambulatory Visit: Payer: Self-pay

## 2018-03-01 ENCOUNTER — Ambulatory Visit: Payer: Self-pay | Admitting: *Deleted

## 2018-03-01 ENCOUNTER — Encounter (HOSPITAL_COMMUNITY): Payer: Self-pay | Admitting: Emergency Medicine

## 2018-03-01 DIAGNOSIS — H8110 Benign paroxysmal vertigo, unspecified ear: Secondary | ICD-10-CM

## 2018-03-01 MED ORDER — MECLIZINE HCL 25 MG PO TABS: 25.0000 mg | ORAL_TABLET | Freq: Three times a day (TID) | ORAL | 0 refills | Status: AC | PRN

## 2018-03-01 NOTE — ED Triage Notes (Signed)
The patient presented to the [facility] with a complaint of dizziness x 3 days that increased when laying and position changes.

## 2018-03-01 NOTE — ED Provider Notes (Signed)
[facility]    CSN: [id number] Arrival date & time: 03/01/18  1429     History   Chief Complaint Chief Complaint  Patient presents with  . Dizziness    HPI Darryl Boyd is a 62 y.o. male.   Patient is a 62 year old male presents for increased dizziness upon position change over the past 3 days.  His symptoms are worse when lying down and changing head position from left to right.  He does get some nausea with it at times.  He denies any history of the same issue.  He denies any ear pain, tinnitus, hearing loss.  He denies any recent URI symptoms or fever.  He denies any headache, chest pain or shortness of breath.  He denies any fall or head injury.  He denies any unilateral weakness, slurred speech, facial droop.  He did have recent travel to [place]. No history of vertigo  ROS per HPI      Past Medical History:  Diagnosis Date  . Hypercholesteremia   . Hypertension     Patient Active Problem List   Diagnosis Date Noted  . Essential hypertension 06/15/2017  . Dyslipidemia 06/15/2017  . Class 1 obesity due to excess calories with serious comorbidity and body mass index (BMI) of 30.0 to 30.9 in adult 06/15/2017    History reviewed. No pertinent surgical history.     Home Medications    Prior to Admission medications   Medication Sig Start Date End Date Taking? Authorizing Provider  ibuprofen (ADVIL,MOTRIN) 800 MG tablet Take 1 tablet (800 mg total) by mouth 3 (three) times daily. 09/04/17  Yes Wieters, Hallie C, PA-C  lisinopril-hydrochlorothiazide (PRINZIDE,ZESTORETIC) 20-25 MG tablet Take 1 tablet by mouth daily. 06/15/17  Yes Stallings, Zoe A, MD  lovastatin (MEVACOR) 20 MG tablet Take 1 tablet (20 mg total) by mouth daily. 06/15/17  Yes Stallings, Zoe A, MD  verapamil (CALAN) 80 MG tablet Take 1 tablet (80 mg total) by mouth daily. 06/15/17  Yes Doristine Bosworth, MD  meclizine (ANTIVERT) 25 MG tablet Take 1 tablet (25 mg total) by mouth 3 (three)  times daily as needed for dizziness. 03/01/18   Janace Aris, NP    Family History History reviewed. No pertinent family history.  Social History Social History   Tobacco Use  . Smoking status: Former Games developer  . Smokeless tobacco: Never Used  Substance Use Topics  . Alcohol use: Yes  . Drug use: No     Allergies   Patient has no known allergies.   Review of Systems Review of Systems   Physical Exam Triage Vital Signs ED Triage Vitals  Enc Vitals Group     BP 03/01/18 1510 (!) 148/72     Pulse Rate 03/01/18 1510 (!) 51     Resp 03/01/18 1510 16     Temp 03/01/18 1510 97.9 F (36.6 C)     Temp Source 03/01/18 1510 Oral     SpO2 03/01/18 1510 97 %     Weight --      Height --      Head Circumference --      Peak Flow --      Pain Score 03/01/18 1511 0     Pain Loc --      Pain Edu? --      Excl. in GC? --    No data found.  Updated Vital Signs BP (!) 148/72 (BP Location: Left Arm)   Pulse (!) 51  Temp 97.9 F (36.6 C) (Oral)   Resp 16   SpO2 97%   Visual Acuity Right Eye Distance:   Left Eye Distance:   Bilateral Distance:    Right Eye Near:   Left Eye Near:    Bilateral Near:     Physical Exam  Constitutional: He appears well-developed and well-nourished.  Very pleasant. Non toxic or ill appearing.   HENT:  Head: Normocephalic and atraumatic.  Bilateral TMs normal  Eyes: Pupils are equal, round, and reactive to light. Conjunctivae and EOM are normal.  No nystagmus  Neck: Normal range of motion.  Cardiovascular: Normal rate, regular rhythm and normal heart sounds.  Pulmonary/Chest: Effort normal and breath sounds normal.  Lungs clear in all fields. No dyspnea or distress. No retractions or nasal flaring.   Abdominal: Soft.  Musculoskeletal: Normal range of motion.  Neurological: He is alert.  Negative romberg No focal neuro deficits  Cranial nerves grossly intact Strength 5/5 in all extremities  Skin: Skin is warm and dry.    Psychiatric: He has a normal mood and affect.  Nursing note and vitals reviewed.    UC Treatments / Results  Labs (all labs ordered are listed, but only abnormal results are displayed) Labs Reviewed - No data to display  EKG None  Radiology No results found.  Procedures Procedures (including critical care time)  Medications Ordered in UC Medications - No data to display  Initial Impression / Assessment and Plan / UC Course  I have reviewed the triage vital signs and the nursing notes.  Pertinent labs & imaging results that were available during my care of the patient were reviewed by me and considered in my medical decision making (see chart for details).     Neurological exam normal, no focal neuro deficits.  No concern for CVA or any other intracranial abnormality.  Vital signs stable, nontoxic or ill-appearing  Most likely symptoms are related to BPPV We will treat this with meclizine as needed Patient given instructions on Epley's maneuver If not better in the next couple of weeks he may want to follow-up with ENT for further management Patient understanding and agreeable to plan  Final Clinical Impressions(s) / UC Diagnoses   Final diagnoses:  Benign paroxysmal positional vertigo, unspecified laterality     Discharge Instructions     Believe your symptoms are related to vertigo There is no concerning signs on your exam for stroke or any other intracranial abnormality. You can try the meclizine to help with the dizziness There is also a maneuver that you can try at home called the Epley maneuver that can help improve the vertigo For continued or worsening symptoms please go to the hospital    ED Prescriptions    Medication Sig Dispense Auth. Provider   meclizine (ANTIVERT) 25 MG tablet Take 1 tablet (25 mg total) by mouth 3 (three) times daily as needed for dizziness. 30 tablet Dahlia Byes A, NP     Controlled Substance Prescriptions [place] Controlled  Substance Registry consulted? Not Applicable   Janace Aris, NP 03/01/18 1643

## 2018-03-01 NOTE — Telephone Encounter (Signed)
Pt reports dizziness since Friday. States "Better over the weekend, now back today." States positional, occurs going from sitting to standing, when turning head, lying down. 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 Denies any headache, weakness. Nausea friday, not presently, no emesis, CP, SOB. Speech clear during call. States has not had any cold/congestion, no earache.  States is staying hydrated. Reports BP "Has been good, 126/?"  HR 52, states "Normal for me."  Unable to secure appt for today, pt directed to ED/UC. States he will go to [facility], son will drive.  Reason for Disposition . [1] Dizziness (vertigo) present now AND [2] age > 34  (Exception: prior physician evaluation for this AND no different/worse than usual)  Answer Assessment - Initial Assessment Questions 1. DESCRIPTION: "Describe your dizziness."     Spinning at times, positional 2. VERTIGO: "Do you feel like either you or the room is spinning or tilting?"      Yes with positional changes, resolves 3. LIGHTHEADED: "Do you feel lightheaded?" (e.g., somewhat faint, woozy, weak upon standing)     yes 4. SEVERITY: "How bad is it?"  "Can you walk?"   - MILD - Feels unsteady but walking normally.   - MODERATE - Feels very unsteady when walking, but not falling; interferes with normal activities (e.g., school, work) .   - SEVERE - Unable to walk without falling (requires assistance).     Moderate at first, mild after standing for a while 5. ONSET:  "When did the dizziness begin?"     Friday 6. AGGRAVATING FACTORS: "Does anything make it worse?" (e.g., standing, change in head position)     Yes, positional; turning head, sitting to standing, lying down. 7. CAUSE: "What do you think is causing the dizziness?"     Unsure 8. RECURRENT SYMPTOM: "Have you had dizziness before?" If so, ask: "When was the last time?" "What happened that time?"     no 9. OTHER SYMPTOMS: "Do you  have any other symptoms?" (e.g., headache, weakness, numbness, vomiting, earache)    Nausea Friday, not presently  Protocols used: DIZZINESS - VERTIGO-A-AH

## 2018-03-01 NOTE — Discharge Instructions (Signed)
Believe your symptoms are related to vertigo There is no concerning signs on your exam for stroke or any other intracranial abnormality. You can try the meclizine to help with the dizziness There is also a maneuver that you can try at home called the Epley maneuver that can help improve the vertigo For continued or worsening symptoms please go to the hospital

## 2018-03-04 NOTE — Telephone Encounter (Signed)
FYI

## 2018-06-29 ENCOUNTER — Other Ambulatory Visit: Payer: Self-pay | Admitting: Chiropractic Medicine

## 2018-06-29 DIAGNOSIS — R9389 Abnormal findings on diagnostic imaging of other specified body structures: Secondary | ICD-10-CM

## 2018-06-30 ENCOUNTER — Other Ambulatory Visit: Payer: Self-pay

## 2018-07-03 ENCOUNTER — Other Ambulatory Visit: Payer: Self-pay

## 2018-07-03 ENCOUNTER — Ambulatory Visit
Admission: RE | Admit: 2018-07-03 | Discharge: 2018-07-03 | Disposition: A | Discharge status: Home / Self Care | Payer: Self-pay | Source: Ambulatory Visit | Attending: Chiropractic Medicine | Admitting: Chiropractic Medicine

## 2018-07-03 DIAGNOSIS — R9389 Abnormal findings on diagnostic imaging of other specified body structures: Secondary | ICD-10-CM

## 2018-07-03 MED ORDER — GADOBENATE DIMEGLUMINE 529 MG/ML IV SOLN
17.0000 mL | Freq: Once | INTRAVENOUS | Status: AC | PRN
  Administered 2018-07-03: 17 mL via INTRAVENOUS

## 2018-12-01 ENCOUNTER — Other Ambulatory Visit: Payer: Self-pay

## 2018-12-01 ENCOUNTER — Ambulatory Visit (HOSPITAL_COMMUNITY)
Admission: EM | Admit: 2018-12-01 | Discharge: 2018-12-01 | Disposition: A | Discharge status: Home / Self Care | Payer: Self-pay | Attending: Urgent Care | Admitting: Urgent Care

## 2018-12-01 ENCOUNTER — Encounter (HOSPITAL_COMMUNITY): Payer: Self-pay

## 2018-12-01 DIAGNOSIS — R350 Frequency of micturition: Secondary | ICD-10-CM

## 2018-12-01 DIAGNOSIS — R1031 Right lower quadrant pain: Secondary | ICD-10-CM

## 2018-12-01 LAB — POCT URINALYSIS DIP (DEVICE)
Bilirubin Urine: NEGATIVE
Glucose, UA: NEGATIVE mg/dL
Hgb urine dipstick: NEGATIVE
Ketones, ur: NEGATIVE mg/dL
Leukocytes,Ua: NEGATIVE
Nitrite: NEGATIVE
Protein, ur: NEGATIVE mg/dL
Specific Gravity, Urine: >=1.03 (ref 1.005–1.030)
Urobilinogen, UA: 2 mg/dL — ABNORMAL HIGH (ref 0.0–1.0)
pH: 7 (ref 5.0–8.0)

## 2018-12-01 LAB — GLUCOSE, CAPILLARY: Glucose-Capillary: 109 mg/dL — ABNORMAL HIGH (ref 70–99)

## 2018-12-01 MED ORDER — TRAMADOL HCL 50 MG PO TABS: 50.0000 mg | ORAL_TABLET | Freq: Four times a day (QID) | ORAL | 0 refills | Status: AC | PRN

## 2018-12-01 NOTE — ED Provider Notes (Signed)
MRN: [id number] DOB: 08/23/1955  Subjective:   Darryl Boyd is a 63 y.o. male with past medical history of hypertension, hyperlipidemia presenting for 3-day history acute onset worsening intermittent sharp/stabbing pains of right lower abdomen.  He had 1 day of urinary frequency that has since resolved.  Symptoms occur randomly can be moving, twisting or just sitting.  Has a history of kidney stones, last episode was several years ago.  No current facility-administered medications for this encounter.   Current Outpatient Medications:  .  ibuprofen (ADVIL,MOTRIN) 800 MG tablet, Take 1 tablet (800 mg total) by mouth 3 (three) times daily., Disp: 30 tablet, Rfl: 0 .  lisinopril-hydrochlorothiazide (PRINZIDE,ZESTORETIC) 20-25 MG tablet, Take 1 tablet by mouth daily., Disp: 90 tablet, Rfl: 1 .  lovastatin (MEVACOR) 20 MG tablet, Take 1 tablet (20 mg total) by mouth daily., Disp: 90 tablet, Rfl: 1 .  meclizine (ANTIVERT) 25 MG tablet, Take 1 tablet (25 mg total) by mouth 3 (three) times daily as needed for dizziness., Disp: 30 tablet, Rfl: 0 .  verapamil (CALAN) 80 MG tablet, Take 1 tablet (80 mg total) by mouth daily., Disp: 90 tablet, Rfl: 1   No Known Allergies  Past Medical History:  Diagnosis Date  . Hypercholesteremia   . Hypertension      History reviewed. No pertinent surgical history.  ROS  Objective:   Vitals: BP (!) 153/86 (BP Location: Right Arm)   Pulse 67   Temp 97.8 F (36.6 C)   Resp 18   Wt 183 lb (83 kg)   SpO2 98%   BMI 29.54 kg/m   Physical Exam Constitutional:      General: He is not in acute distress.    Appearance: Normal appearance. He is well-developed. He is not ill-appearing, toxic-appearing or diaphoretic.  HENT:     Head: Normocephalic and atraumatic.     Right Ear: External ear normal.     Left Ear: External ear normal.     Nose: Nose normal.     Mouth/Throat:     Mouth: Mucous membranes are moist.     Pharynx: Oropharynx is clear.   Eyes:     General: No scleral icterus.    Extraocular Movements: Extraocular movements intact.     Pupils: Pupils are equal, round, and reactive to light.  Cardiovascular:     Rate and Rhythm: Normal rate and regular rhythm.     Heart sounds: Normal heart sounds. No murmur. No friction rub. No gallop.   Pulmonary:     Effort: Pulmonary effort is normal. No respiratory distress.     Breath sounds: Normal breath sounds. No stridor. No wheezing, rhonchi or rales.  Abdominal:     General: Bowel sounds are normal. There is no distension.     Palpations: Abdomen is soft. There is no mass.     Tenderness: There is abdominal tenderness in the right lower quadrant. There is no guarding or rebound.  Skin:    General: Skin is warm and dry.  Neurological:     Mental Status: He is alert and oriented to person, place, and time.  Psychiatric:        Mood and Affect: Mood normal.        Behavior: Behavior normal.        Thought Content: Thought content normal.     Results for orders placed or performed during the hospital encounter of 12/01/18 (from the past 24 hour(s))  Glucose, capillary     Status: Abnormal  Collection Time: 12/01/18  6:13 PM  Result Value Ref Range   Glucose-Capillary 109 (H) 70 - 99 mg/dL  POCT urinalysis dip (device)     Status: Abnormal   Collection Time: 12/01/18  6:17 PM  Result Value Ref Range   Glucose, UA NEGATIVE NEGATIVE mg/dL   Bilirubin Urine NEGATIVE NEGATIVE   Ketones, ur NEGATIVE NEGATIVE mg/dL   Specific Gravity, Urine >=1.030 1.005 - 1.030   Hgb urine dipstick NEGATIVE NEGATIVE   pH 7.0 5.0 - 8.0   Protein, ur NEGATIVE NEGATIVE mg/dL   Urobilinogen, UA 2.0 (H) 0.0 - 1.0 mg/dL   Nitrite NEGATIVE NEGATIVE   Leukocytes,Ua NEGATIVE NEGATIVE    Assessment and Plan :   1. Acute right lower quadrant pain   2. Urinary frequency     Discussed differential with patient including diverticulitis, appendicitis, renal colic, hernia, etc.  Patient would be  best served by obtaining a CT abdomen as an outpatient as he is not an emergency room caliber patient.  He is in agreement.  We will try to control pain using tramadol.  Recommended patient avoid use of NSAID.  Counseled on need to establish PCP.  Patient dropped in a work queue for PCP assistance. Counseled patient on potential for adverse effects with medications prescribed/recommended today, ER and return-to-clinic precautions discussed, patient verbalized understanding.    Jaynee Eagles, [place] 12/01/18 1846

## 2018-12-01 NOTE — ED Triage Notes (Signed)
Pt states he has right side pain. Pt states the pain is getting worst. Pt states the pain is waking him out of his sleep. This has been going on for 3 days its worst now.

## 2019-05-04 IMAGING — MR MRI LUMBAR SPINE WITHOUT AND WITH CONTRAST
4 of 7 series · 16 of 48 positions shown · IV contrast (17 ml multihance)
Comparison: Radiographic report [REDACTED] and Wellness (no
images available).

CLINICAL DATA: 62-year-old male with left side pain for 1 month
with no known injury. Abnormal spinal x-rays at [facility],
question left L4-L5 pedicle abnormality.

Creatinine was obtained on site at [HOSPITAL] at [HOSPITAL].
Results: Creatinine 1.2 mg/dL.
EXAM:
MRI LUMBAR SPINE WITHOUT AND WITH CONTRAST
TECHNIQUE: Multiplanar and multiecho pulse sequences of the lumbar spine were
obtained without and with intravenous contrast.
CONTRAST:  17mL MULTIHANCE GADOBENATE DIMEGLUMINE 529 MG/ML IV SOLN

[Series 5: T1 · sagittal · 4.0mm · 0.73mm/px · 3 of 15 slices shown (1 of 2)]
[im 1/15]
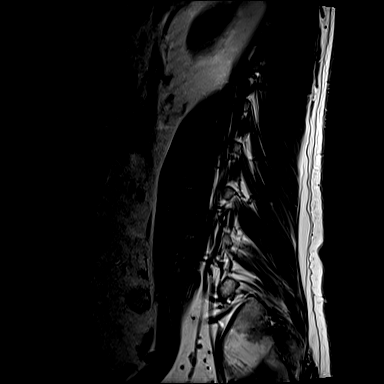
[im 8/15]
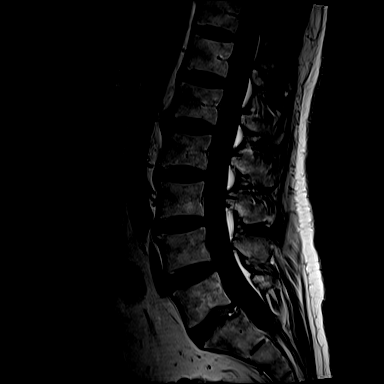
[im 15/15]
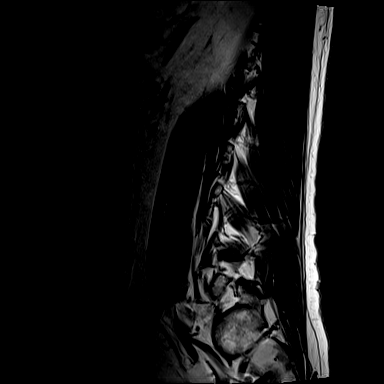

[Series 9: T1 · axial · 4.0mm · 0.28mm/px · z∈[-105,+94]mm · 3 of 44 slices shown (2 of 2)]
[im 5/44]
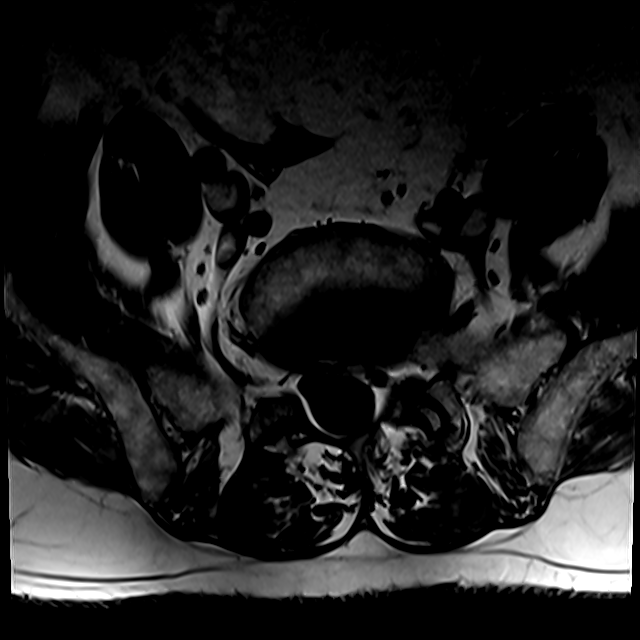
[im 22/44]
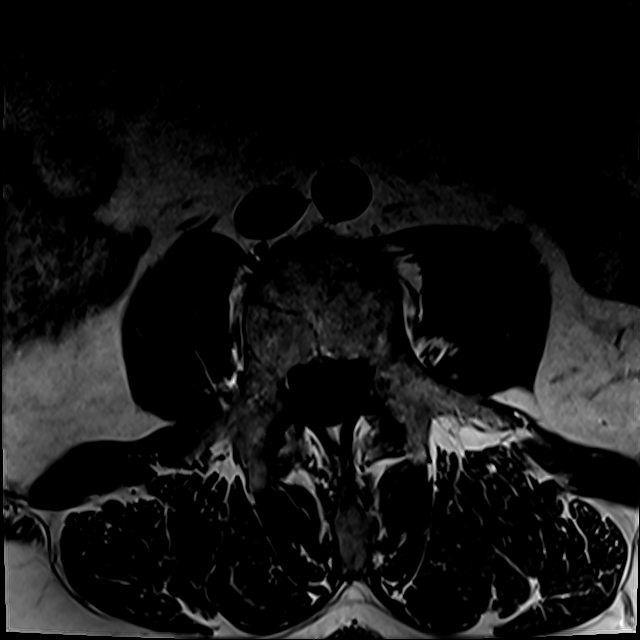
[im 39/44]
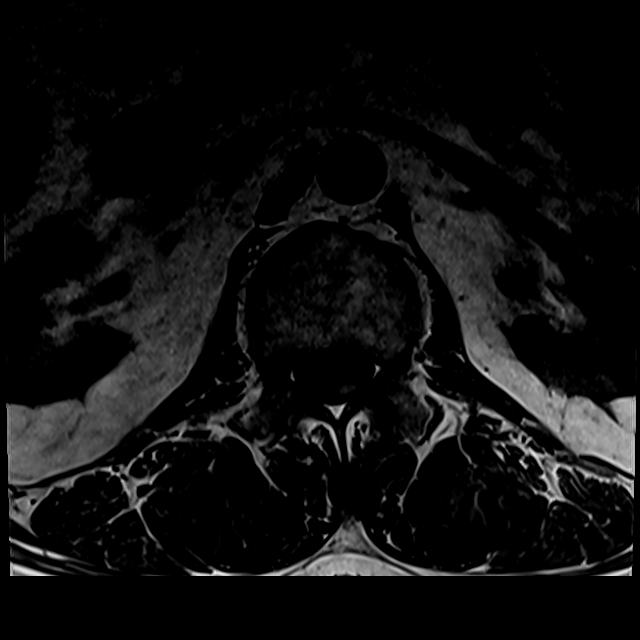

[Series 12: T2 · axial · 4.0mm · 0.28mm/px · z∈[-124,+94]mm · 7 of 44 slices shown (1 of 2)]
[im 1/44]
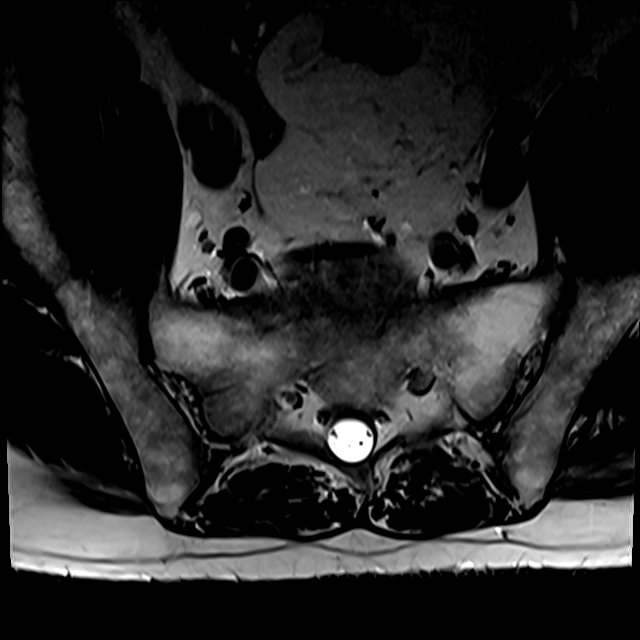
[im 5/44]
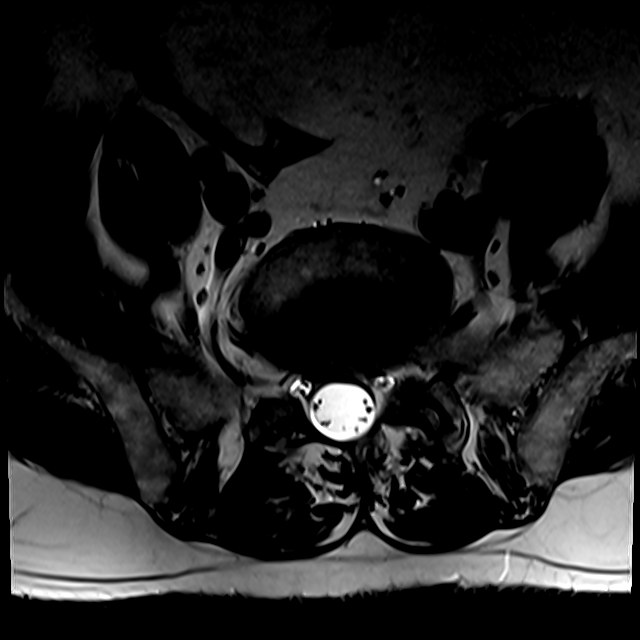
[im 9/44]
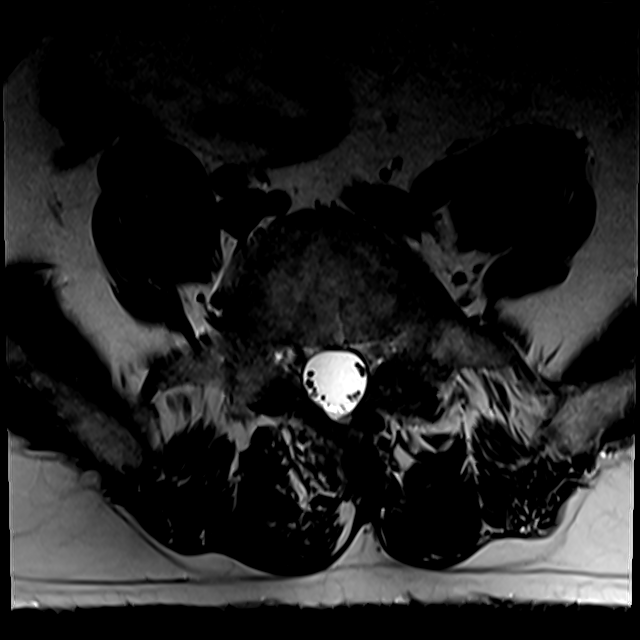
[im 13/44]
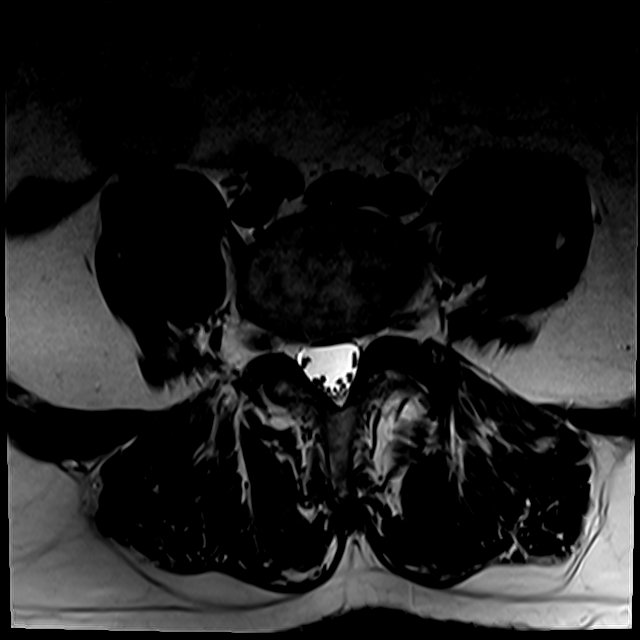
[im 18/44]
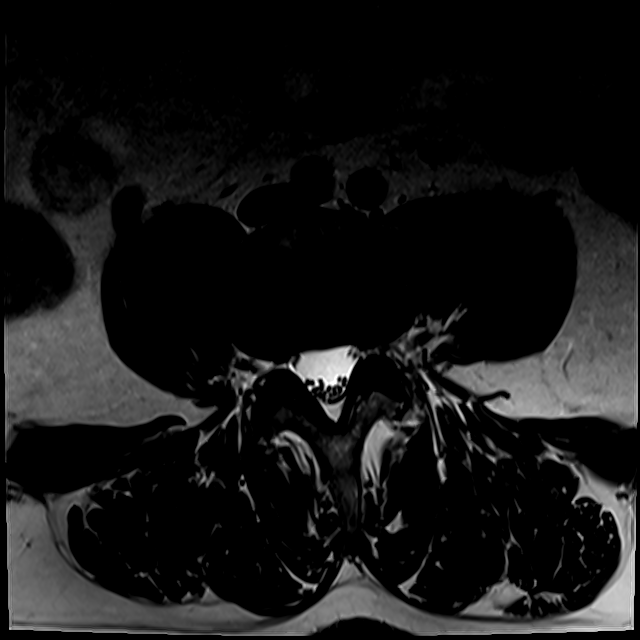
[im 22/44]
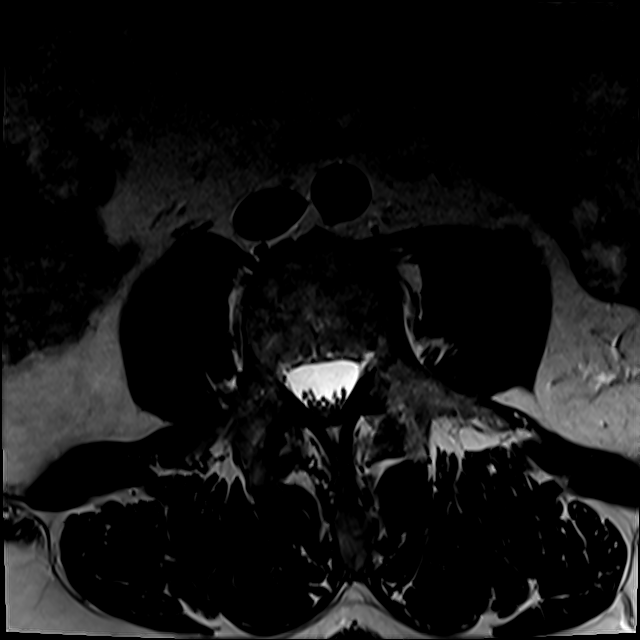
[im 39/44]
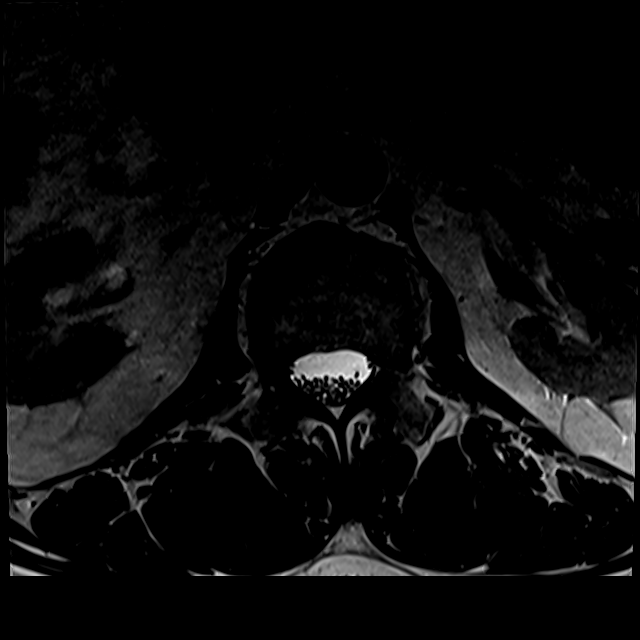

[Series 13: T2 · sagittal · 4.0mm · 0.73mm/px · 3 of 15 slices shown (2 of 2)]
[im 1/15]
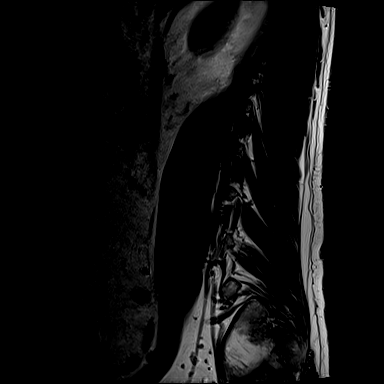
[im 10/15]
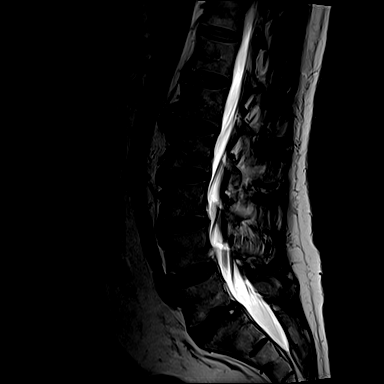
[im 15/15]
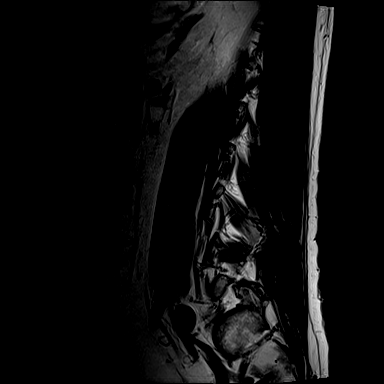

[16 of 48 positions shown; findings below may reference images not displayed]

FINDINGS: Segmentation: Lumbar segmentation appears to be normal and will be
designated as such for this report.

Alignment: Preserved lumbar lordosis. Subtle anterolisthesis of L5
on S1.

Vertebrae: Normal background bone marrow signal. There is a chronic
right L5 pars fracture (series 13, image 5). There is asymmetric
hypertrophy of the left L4-L5 and L5-S1 facets, but there is
posterior elements otherwise are normal. No marrow edema or evidence
of acute osseous abnormality. No abnormal enhancement identified.

Intact visible sacrum and SI joints.

Conus medullaris and cauda equina: Conus extends to the T11-T12
level. Conus and cauda equina appear normal. No abnormal intradural
enhancement. No dural thickening.

Paraspinal and other soft tissues: Negative.

Disc levels:

T11-T12: Negative.

T12-L1:  Negative.

L1-L2: Mild disc desiccation and circumferential disc bulge. Mild
posterior element hypertrophy. Borderline to mild bilateral L1
foraminal stenosis.

L2-L3: Mild disc desiccation and mostly far lateral disc bulging. No
stenosis.

L3-L4: Mild disc desiccation and circumferential disc bulge. No
stenosis.

L4-L5: Moderate bilateral facet hypertrophy, greater on the left.
Mild endplate spurring. No spinal or lateral recess stenosis.
Borderline to mild bilateral L4 foraminal stenosis.

L5-S1: Mild disc desiccation. Moderate facet hypertrophy on the
left. Chronic pars fracture on the right with mild to moderate
hypertrophy. Mild endplate spurring. No spinal or lateral recess
stenosis. Borderline to mild left L5 foraminal stenosis.
IMPRESSION: 1. No acute or suspicious osseous lesion.
Chronic unilateral right L5 pars fracture with moderate left greater
than right facet hypertrophy at L4-L5 and L5-S1.
Only trace associated anterolisthesis of L5 on S1.
No lumbar spinal or lateral recess stenosis. Up to mild bilateral L4
and left L5 foraminal stenosis.
2. Mild L1-L2 disc degeneration with circumferential disc bulging
and up to mild bilateral L1 foraminal stenosis.

## 2019-06-20 ENCOUNTER — Ambulatory Visit: Payer: Self-pay | Attending: Internal Medicine

## 2019-06-20 DIAGNOSIS — Z23 Encounter for immunization: Secondary | ICD-10-CM | POA: Insufficient documentation

## 2019-06-20 NOTE — Progress Notes (Signed)
   Covid-19 Vaccination Clinic  Name:  BRANDO TAVES    MRN: [id number] DOB: [birth year]  06/20/2019  Mr. Sylvia was observed post Covid-19 immunization for 15 minutes without incidence. He was provided with Vaccine Information Sheet and instruction to access the V-Safe system.   Mr. Mussell was instructed to call 911 with any severe reactions post vaccine: Marland Kitchen Difficulty breathing  . Swelling of your face and throat  . A fast heartbeat  . A bad rash all over your body  . Dizziness and weakness    Immunizations Administered    Name Date Dose VIS Date Route   Pfizer COVID-19 Vaccine 06/20/2019  4:28 PM 0.3 mL 04/01/2019 Intramuscular   Manufacturer: [employer], [employer]   Lot: [id number]   NDC: [address]-3

## 2019-07-12 ENCOUNTER — Ambulatory Visit: Payer: Self-pay | Attending: Internal Medicine

## 2019-07-12 DIAGNOSIS — Z23 Encounter for immunization: Secondary | ICD-10-CM

## 2019-07-12 NOTE — Progress Notes (Signed)
   Covid-19 Vaccination Clinic  Name:  Darryl Boyd    MRN: [id number] DOB: 09-12-1955  07/12/2019  Mr. Darryl Boyd was observed post Covid-19 immunization for 15 minutes without incident. He was provided with Vaccine Information Sheet and instruction to access the V-Safe system.   Mr. Darryl Boyd was instructed to call 911 with any severe reactions post vaccine: Marland Kitchen Difficulty breathing  . Swelling of face and throat  . A fast heartbeat  . A bad rash all over body  . Dizziness and weakness   Immunizations Administered    Name Date Dose VIS Date Route   Pfizer COVID-19 Vaccine 07/12/2019  8:41 AM 0.3 mL 04/01/2019 Intramuscular   Manufacturer: [employer], [employer]   Lot: [id number]   NDC: [address]-5

## 2020-08-21 ENCOUNTER — Other Ambulatory Visit: Payer: Self-pay | Admitting: Otolaryngology

## 2020-08-22 ENCOUNTER — Other Ambulatory Visit: Payer: Self-pay | Admitting: Otolaryngology

## 2020-08-22 DIAGNOSIS — H918X9 Other specified hearing loss, unspecified ear: Secondary | ICD-10-CM

## 2020-09-08 ENCOUNTER — Ambulatory Visit
Admission: RE | Admit: 2020-09-08 | Discharge: 2020-09-08 | Disposition: A | Discharge status: Home / Self Care | Payer: Medicare Other | Source: Ambulatory Visit | Attending: Otolaryngology | Admitting: Otolaryngology

## 2020-09-08 DIAGNOSIS — H918X9 Other specified hearing loss, unspecified ear: Secondary | ICD-10-CM

## 2020-09-08 MED ORDER — GADOBENATE DIMEGLUMINE 529 MG/ML IV SOLN
15.0000 mL | Freq: Once | INTRAVENOUS | Status: AC | PRN
  Administered 2020-09-08: 15 mL via INTRAVENOUS

## 2021-03-15 ENCOUNTER — Other Ambulatory Visit: Payer: Self-pay

## 2021-03-15 ENCOUNTER — Ambulatory Visit (HOSPITAL_COMMUNITY)
Admission: EM | Admit: 2021-03-15 | Discharge: 2021-03-15 | Disposition: A | Discharge status: Home / Self Care | Payer: Medicare Other | Attending: Internal Medicine | Admitting: Internal Medicine

## 2021-03-15 ENCOUNTER — Encounter (HOSPITAL_COMMUNITY): Payer: Self-pay

## 2021-03-15 DIAGNOSIS — T7840XA Allergy, unspecified, initial encounter: Secondary | ICD-10-CM | POA: Diagnosis not present

## 2021-03-15 MED ORDER — DEXAMETHASONE SODIUM PHOSPHATE 10 MG/ML IJ SOLN
10.0000 mg | Freq: Once | INTRAMUSCULAR | Status: AC
  Administered 2021-03-15: 10 mg via INTRAMUSCULAR

## 2021-03-15 MED ORDER — DEXAMETHASONE SODIUM PHOSPHATE 10 MG/ML IJ SOLN
INTRAMUSCULAR | Status: AC
  Filled 2021-03-15: qty 1

## 2021-03-15 MED ORDER — PREDNISONE 20 MG PO TABS: 20.0000 mg | ORAL_TABLET | Freq: Every day | ORAL | 0 refills | Status: AC

## 2021-03-15 MED ORDER — FAMOTIDINE 20 MG PO TABS: 20.0000 mg | ORAL_TABLET | Freq: Two times a day (BID) | ORAL | 0 refills | Status: AC

## 2021-03-15 NOTE — Discharge Instructions (Addendum)
Please take medications as prescribed If she has shortness of breath, noisy breathing or feeling of throat closing up please go to the emergency department immediately to be reevaluated.

## 2021-03-15 NOTE — ED Triage Notes (Signed)
Pt presents with allergic reaction to unknown source all over body that started this evening.

## 2021-03-17 NOTE — ED Provider Notes (Signed)
[facility]    CSN: [id number] Arrival date & time: 03/15/21  1707      History   Chief Complaint Chief Complaint  Patient presents with   Allergic Reaction    HPI Darryl Boyd is a 65 y.o. male comes to the urgent care with sudden onset of hives about an hour or so ago.  Patient denies any significant event prior to the hives.  The rash was generalized and pruritic.  It was associated with swelling of the hands.  This is the second episode of such a reaction.  Patient is not allergic to any food or medications.  He denies any shortness of breath or wheezing.  He took a dose of Benadryl with improvement in his symptoms.  He came to urgent care to be evaluated further.  No changes in medications, cosmetics or detergents.  Dietary habits have not changed.  HPI  Past Medical History:  Diagnosis Date   Hypercholesteremia    Hypertension     Patient Active Problem List   Diagnosis Date Noted   Essential hypertension 06/15/2017   Dyslipidemia 06/15/2017   Class 1 obesity due to excess calories with serious comorbidity and body mass index (BMI) of 30.0 to 30.9 in adult 06/15/2017    History reviewed. No pertinent surgical history.     Home Medications    Prior to Admission medications   Medication Sig Start Date End Date Taking? Authorizing Provider  famotidine (PEPCID) 20 MG tablet Take 1 tablet (20 mg total) by mouth 2 (two) times daily. 03/15/21  Yes Kennedy Brines, Britta Mccreedy, MD  predniSONE (DELTASONE) 20 MG tablet Take 1 tablet (20 mg total) by mouth daily for 3 days. 03/15/21 03/18/21 Yes Kaliyan Osbourn, Britta Mccreedy, MD  ibuprofen (ADVIL,MOTRIN) 800 MG tablet Take 1 tablet (800 mg total) by mouth 3 (three) times daily. 09/04/17   Wieters, Hallie C, PA-C  lisinopril-hydrochlorothiazide (PRINZIDE,ZESTORETIC) 20-25 MG tablet Take 1 tablet by mouth daily. 06/15/17   Doristine Bosworth, MD  lovastatin (MEVACOR) 20 MG tablet Take 1 tablet (20 mg total) by mouth daily. 06/15/17    Doristine Bosworth, MD  meclizine (ANTIVERT) 25 MG tablet Take 1 tablet (25 mg total) by mouth 3 (three) times daily as needed for dizziness. 03/01/18   Dahlia Byes A, NP  verapamil (CALAN) 80 MG tablet Take 1 tablet (80 mg total) by mouth daily. 06/15/17   Doristine Bosworth, MD    Family History History reviewed. No pertinent family history.  Social History Social History   Tobacco Use   Smoking status: Former   Smokeless tobacco: Never  Building services engineer Use: Never used  Substance Use Topics   Alcohol use: Yes   Drug use: No     Allergies   Patient has no known allergies.   Review of Systems Review of Systems  HENT: Negative.    Gastrointestinal: Negative.   Genitourinary: Negative.   Musculoskeletal: Negative.   Skin:  Positive for color change and rash. Negative for pallor and wound.  Psychiatric/Behavioral:  Negative for confusion.     Physical Exam Triage Vital Signs ED Triage Vitals  Enc Vitals Group     BP 03/15/21 1900 (!) 166/96     Pulse Rate 03/15/21 1900 76     Resp 03/15/21 1900 18     Temp 03/15/21 1900 98.3 F (36.8 C)     Temp Source 03/15/21 1900 Oral     SpO2 03/15/21 1900 96 %  Weight --      Height --      Head Circumference --      Peak Flow --      Pain Score 03/15/21 1859 0     Pain Loc --      Pain Edu? --      Excl. in GC? --    No data found.  Updated Vital Signs BP (!) 166/96 (BP Location: Right Arm)   Pulse 76   Temp 98.3 F (36.8 C) (Oral)   Resp 18   SpO2 96%   Visual Acuity Right Eye Distance:   Left Eye Distance:   Bilateral Distance:    Right Eye Near:   Left Eye Near:    Bilateral Near:     Physical Exam Vitals and nursing note reviewed.  Constitutional:      General: He is not in acute distress.    Appearance: He is not ill-appearing.  HENT:     Mouth/Throat:     Comments: Uvula is visualized.  No swelling.  Oropharynx is clearly visible. Cardiovascular:     Rate and Rhythm: Normal rate and  regular rhythm.     Pulses: Normal pulses.     Heart sounds: Normal heart sounds.  Pulmonary:     Effort: Pulmonary effort is normal.     Breath sounds: Normal breath sounds. No stridor. No wheezing, rhonchi or rales.  Musculoskeletal:        General: Normal range of motion.  Skin:    Comments: Hives noted on extremities.  Bilateral hand swelling.  Neurological:     Mental Status: He is alert.     UC Treatments / Results  Labs (all labs ordered are listed, but only abnormal results are displayed) Labs Reviewed - No data to display  EKG   Radiology No results found.  Procedures Procedures (including critical care time)  Medications Ordered in UC Medications  dexamethasone (DECADRON) injection 10 mg (10 mg Intramuscular Given 03/15/21 1912)    Initial Impression / Assessment and Plan / UC Course  I have reviewed the triage vital signs and the nursing notes.  Pertinent labs & imaging results that were available during my care of the patient were reviewed by me and considered in my medical decision making (see chart for details).     1.  Allergic reaction: Dexamethasone 10 mg IM x1 dose Prednisone 20 mg orally daily x3 days Famotidine 20 mg orally twice daily x3 days Continue Benadryl If symptoms worsen please return to urgent care or go to emergency department for further management. Final Clinical Impressions(s) / UC Diagnoses   Final diagnoses:  Allergic reaction, initial encounter     Discharge Instructions      Please take medications as prescribed If she has shortness of breath, noisy breathing or feeling of throat closing up please go to the emergency department immediately to be reevaluated.   ED Prescriptions     Medication Sig Dispense Auth. Provider   famotidine (PEPCID) 20 MG tablet Take 1 tablet (20 mg total) by mouth 2 (two) times daily. 30 tablet Maureen Duesing, Britta Mccreedy, MD   predniSONE (DELTASONE) 20 MG tablet Take 1 tablet (20 mg total) by mouth  daily for 3 days. 3 tablet Latashia Koch, Britta Mccreedy, MD      PDMP not reviewed this encounter.   Merrilee Jansky, MD 03/17/21 1438

## 2023-06-23 ENCOUNTER — Ambulatory Visit (INDEPENDENT_AMBULATORY_CARE_PROVIDER_SITE_OTHER): Payer: Self-pay | Admitting: Audiology

## 2023-06-23 DIAGNOSIS — H903 Sensorineural hearing loss, bilateral: Secondary | ICD-10-CM

## 2023-06-23 NOTE — Progress Notes (Unsigned)
  [address] [place], [place] [address] [phone]  Hearing Aid Check     Darryl Boyd comes for a scheduled appointment for a hearing aid check.   Accompanied [id number]   Right Left  Hearing aid manufacturer [employer] Real 2 miniRITE R SN:[id number] [employer] Real 2 miniRITE R SN:[id number]  Hearing aid style Receiver in the ear Receiver in the ear  Hearing aid battery rechargeable rechargeable  Warranty expiration date 08-07-2024 08-07-2024  Initial fitting date 07-10-2021 07-10-2021  Device was fit at: [facility]    Chief complaint: Patient reports that he had his hearing tested at [facility] (because Dr. Suszanne Conners did not accept [facility]) . He notices he cannot hear as well with the left hearing aid.   Actions taken: Both devices were cleaned and seemed to be working well. A hearing spot check was completed to confirm left hearing changes noted on outside audiogram .   Left ear- 125Hz - 55dBHL;   250 Hz-55dBHL;   500Hz - 55dBHL;  1000 Hz - 50dBHL;  2000 Hz- 45dBHL;   4000 Hz-60dBHL ; 8000 Hz-70dBHL  Services fee: $0 was paid at checkout.  Patient's hearing aid was reprogrammed based on new hearing test results. Patient said he liked the changes. The patient was oriented that ideally real ear measurements would be completed when settings are changed. However, he could go to an audiology clinic that has the equipment to have it done since right now I do not have the equipment available.  Recommend: Return for a hearing aid check , as needed. Return for a hearing evaluation and to see an ENT, if concerns with hearing changes arise.    Darryl Boyd MARIE LEROUX-MARTINEZ, AUD

## 2023-08-25 ENCOUNTER — Encounter (INDEPENDENT_AMBULATORY_CARE_PROVIDER_SITE_OTHER): Payer: Self-pay

## 2023-08-25 ENCOUNTER — Ambulatory Visit (INDEPENDENT_AMBULATORY_CARE_PROVIDER_SITE_OTHER)

## 2023-08-25 VITALS — BP 165/65 | Ht 65.0 in | Wt 196.0 lb

## 2023-08-25 DIAGNOSIS — J343 Hypertrophy of nasal turbinates: Secondary | ICD-10-CM

## 2023-08-25 DIAGNOSIS — R0981 Nasal congestion: Secondary | ICD-10-CM

## 2023-08-25 DIAGNOSIS — H9042 Sensorineural hearing loss, unilateral, left ear, with unrestricted hearing on the contralateral side: Secondary | ICD-10-CM | POA: Diagnosis not present

## 2023-08-25 DIAGNOSIS — J310 Chronic rhinitis: Secondary | ICD-10-CM | POA: Diagnosis not present

## 2023-08-25 DIAGNOSIS — H9312 Tinnitus, left ear: Secondary | ICD-10-CM | POA: Diagnosis not present

## 2023-08-25 DIAGNOSIS — R42 Dizziness and giddiness: Secondary | ICD-10-CM

## 2023-08-25 DIAGNOSIS — J342 Deviated nasal septum: Secondary | ICD-10-CM | POA: Diagnosis not present

## 2023-08-25 DIAGNOSIS — H8102 Meniere's disease, left ear: Secondary | ICD-10-CM

## 2023-08-25 NOTE — Progress Notes (Unsigned)
 Patient ID: Darryl Boyd, male   DOB: November 08, 1955, 68 y.o.   MRN: [id number]  Follow-up: Left ear Mnire's disease, hearing loss, nasal obstruction, dizziness  HPI: The patient is a 68 year old male who returns today complaining of progressive worsening of his left ear tinnitus and hearing loss. The patient was previously seen for left ear Mnire's disease. He was experiencing left ear tinnitus, left ear hearing loss, and recurrent dizziness. He was also noted to have chronic rhinitis, nasal mucosal congestion, and bilateral inferior turbinate hypertrophy. The patient was treated with low-salt diet and Flonase  nasal spray.  The patient returns today complaining of progressive worsening of his left ear hearing and tinnitus.  He denies any recent spinning vertigo.  His nasal congestion has improved with the use of Flonase  nasal spray. No other ENT, GI, or respiratory issue noted since the last visit.    Objective Objective note General: Communicates without difficulty, well nourished, no acute distress. Head: Normocephalic, no evidence injury, no tenderness, facial buttresses intact without stepoff. Eyes: PERRL, EOMI. No scleral icterus, conjunctivae clear. Neuro: CN II exam reveals vision grossly intact.  No nystagmus at any point of gaze. Ears: Auricles well formed without lesions.  Ear canals are intact without mass or lesion.  No erythema or edema is appreciated.  The TMs are intact without fluid. Nose: External evaluation reveals normal support and skin without lesions.  Dorsum is intact.  Anterior rhinoscopy reveals congested and edematous mucosa over anterior aspect of the inferior turbinates and nasal septum.  No purulence is noted. Middle meatus is not well visualized. Oral:  Oral cavity and oropharynx are intact, symmetric, without erythema or edema.  Mucosa is moist without lesions. Neck: Full range of motion without pain.  There is no significant lymphadenopathy.  No masses palpable.  Thyroid  bed within normal limits to palpation.  Parotid glands and submandibular glands equal bilaterally without mass.  Trachea is midline. Neuro:  CN 2-12 grossly intact. Gait normal. Vestibular: No nystagmus at any point of gaze.     AUDIOMETRIC TESTING: I have read and reviewed the audiometric test, which shows bilateral high-frequency sensorineural hearing loss with significant asymmetry on the left at low frequencies.  His left ear hearing has worsened.  The speech reception threshold is 20dB AD and 65dB AS. The discrimination score is 88% AD and 36% AS. The tympanogram is normal bilaterally.    Observations Functional status No functional status recorded  Cognitive status No cognitive status recorded  Assessment Assessment note 1.  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  His previous MRI scan was negative.  2.  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  3.  His vestibular symptoms are currently under control with a low-salt diet.  4.  His chronic rhinitis has also improved with the use of Flonase  nasal spray.   Screenings/Interventions/Assessments No screenings/interventions/assessments recorded  Diagnoses attached to encounter No diagnoses attached  Plan Plan note 1.  The physical exam findings and the hearing test results are reviewed with the patient.  2.  Continue with his 1500 mg low-salt diet and Flonase  nasal spray. 3.  The patient is a candidate for hearing amplification.  The hearing aid options are discussed.  4.  The pathophysiology and clinical course of Mnire's disease are also discussed.  Questions are invited and answered.  5.  The patient will return for reevaluation in 3 months.

## 2023-08-26 DIAGNOSIS — J310 Chronic rhinitis: Secondary | ICD-10-CM | POA: Insufficient documentation

## 2023-08-26 DIAGNOSIS — H9042 Sensorineural hearing loss, unilateral, left ear, with unrestricted hearing on the contralateral side: Secondary | ICD-10-CM | POA: Insufficient documentation

## 2023-08-26 DIAGNOSIS — J342 Deviated nasal septum: Secondary | ICD-10-CM | POA: Insufficient documentation

## 2023-08-26 DIAGNOSIS — J343 Hypertrophy of nasal turbinates: Secondary | ICD-10-CM | POA: Insufficient documentation

## 2023-08-26 DIAGNOSIS — R42 Dizziness and giddiness: Secondary | ICD-10-CM | POA: Insufficient documentation

## 2023-08-26 DIAGNOSIS — H9312 Tinnitus, left ear: Secondary | ICD-10-CM | POA: Insufficient documentation
